# Patient Record
Sex: Female | Born: 1970 | Race: White | Hispanic: No | Smoking: Never smoker
Health system: Southern US, Community
[De-identification: ages and names within clinical notes are randomized; demographics above are authoritative.]

## PROBLEM LIST (undated history)

## (undated) DIAGNOSIS — B009 Herpesviral infection, unspecified: Secondary | ICD-10-CM

## (undated) DIAGNOSIS — D649 Anemia, unspecified: Secondary | ICD-10-CM

## (undated) DIAGNOSIS — D219 Benign neoplasm of connective and other soft tissue, unspecified: Secondary | ICD-10-CM

## (undated) DIAGNOSIS — R112 Nausea with vomiting, unspecified: Secondary | ICD-10-CM

## (undated) DIAGNOSIS — F909 Attention-deficit hyperactivity disorder, unspecified type: Secondary | ICD-10-CM

## (undated) DIAGNOSIS — Z9889 Other specified postprocedural states: Secondary | ICD-10-CM

## (undated) DIAGNOSIS — R51 Headache: Secondary | ICD-10-CM

## (undated) DIAGNOSIS — A64 Unspecified sexually transmitted disease: Secondary | ICD-10-CM

## (undated) DIAGNOSIS — K219 Gastro-esophageal reflux disease without esophagitis: Secondary | ICD-10-CM

## (undated) DIAGNOSIS — R87619 Unspecified abnormal cytological findings in specimens from cervix uteri: Secondary | ICD-10-CM

## (undated) HISTORY — PX: CERVICAL BIOPSY  W/ LOOP ELECTRODE EXCISION: SUR135

## (undated) HISTORY — DX: Benign neoplasm of connective and other soft tissue, unspecified: D21.9

## (undated) HISTORY — DX: Unspecified abnormal cytological findings in specimens from cervix uteri: R87.619

## (undated) HISTORY — DX: Unspecified sexually transmitted disease: A64

## (undated) HISTORY — DX: Herpesviral infection, unspecified: B00.9

## (undated) HISTORY — PX: COLPOSCOPY: SHX161

## (undated) HISTORY — DX: Attention-deficit hyperactivity disorder, unspecified type: F90.9

---

## 1998-11-01 ENCOUNTER — Other Ambulatory Visit: Admission: RE | Admit: 1998-11-01 | Discharge: 1998-11-01 | Payer: Self-pay | Admitting: Gynecology

## 2002-07-17 ENCOUNTER — Encounter: Payer: Self-pay | Admitting: Family Medicine

## 2002-07-17 ENCOUNTER — Encounter: Admission: RE | Admit: 2002-07-17 | Discharge: 2002-07-17 | Payer: Self-pay | Admitting: Family Medicine

## 2002-08-24 ENCOUNTER — Encounter: Admission: RE | Admit: 2002-08-24 | Discharge: 2002-08-24 | Payer: Self-pay | Admitting: Family Medicine

## 2002-08-24 ENCOUNTER — Encounter: Payer: Self-pay | Admitting: Family Medicine

## 2004-11-17 ENCOUNTER — Other Ambulatory Visit: Admission: RE | Admit: 2004-11-17 | Discharge: 2004-11-17 | Payer: Self-pay | Admitting: Obstetrics and Gynecology

## 2005-04-17 ENCOUNTER — Ambulatory Visit: Payer: Self-pay | Admitting: Internal Medicine

## 2005-04-24 ENCOUNTER — Ambulatory Visit: Payer: Self-pay | Admitting: Internal Medicine

## 2006-04-11 ENCOUNTER — Ambulatory Visit: Payer: Self-pay | Admitting: Gastroenterology

## 2006-05-16 ENCOUNTER — Ambulatory Visit: Payer: Self-pay | Admitting: Gastroenterology

## 2008-05-04 DIAGNOSIS — A64 Unspecified sexually transmitted disease: Secondary | ICD-10-CM

## 2008-05-04 DIAGNOSIS — B009 Herpesviral infection, unspecified: Secondary | ICD-10-CM

## 2008-05-04 HISTORY — DX: Herpesviral infection, unspecified: B00.9

## 2008-05-04 HISTORY — DX: Unspecified sexually transmitted disease: A64

## 2008-10-14 ENCOUNTER — Other Ambulatory Visit: Admission: RE | Admit: 2008-10-14 | Discharge: 2008-10-14 | Payer: Self-pay | Admitting: Obstetrics and Gynecology

## 2009-03-30 ENCOUNTER — Encounter: Admission: RE | Admit: 2009-03-30 | Discharge: 2009-03-30 | Payer: Self-pay | Admitting: Gastroenterology

## 2009-11-25 ENCOUNTER — Emergency Department (HOSPITAL_COMMUNITY): Admission: EM | Admit: 2009-11-25 | Discharge: 2009-11-26 | Payer: Self-pay | Admitting: Emergency Medicine

## 2010-08-20 LAB — DIFFERENTIAL
Eosinophils Relative: 1 % (ref 0–5)
Lymphocytes Relative: 16 % (ref 12–46)
Lymphs Abs: 1.3 10*3/uL (ref 0.7–4.0)
Monocytes Absolute: 0.5 10*3/uL (ref 0.1–1.0)
Monocytes Relative: 5 % (ref 3–12)

## 2010-08-20 LAB — URINALYSIS, ROUTINE W REFLEX MICROSCOPIC
Bilirubin Urine: NEGATIVE
Glucose, UA: NEGATIVE mg/dL
Hgb urine dipstick: NEGATIVE
Protein, ur: NEGATIVE mg/dL

## 2010-08-20 LAB — CBC
HCT: 33.5 % — ABNORMAL LOW (ref 36.0–46.0)
MCH: 29.5 pg (ref 26.0–34.0)
MCV: 90.2 fL (ref 78.0–100.0)
RBC: 3.71 MIL/uL — ABNORMAL LOW (ref 3.87–5.11)
WBC: 8.5 10*3/uL (ref 4.0–10.5)

## 2010-08-20 LAB — BASIC METABOLIC PANEL
CO2: 26 mEq/L (ref 19–32)
Chloride: 107 mEq/L (ref 96–112)
GFR calc Af Amer: >60 mL/min (ref >60)
Potassium: 3.5 mEq/L (ref 3.5–5.1)

## 2010-08-20 LAB — POCT PREGNANCY, URINE: Preg Test, Ur: NEGATIVE

## 2010-09-02 IMAGING — CR DG LUMBAR SPINE COMPLETE
5 series · 5 of 5 positions shown · non-contrast
Comparison: None.

CLINICAL DATA: Acute onset low back pain.  Difficulty walking or
standing.

LUMBAR SPINE - COMPLETE 4+ VIEW

[t l-spine a.p.]
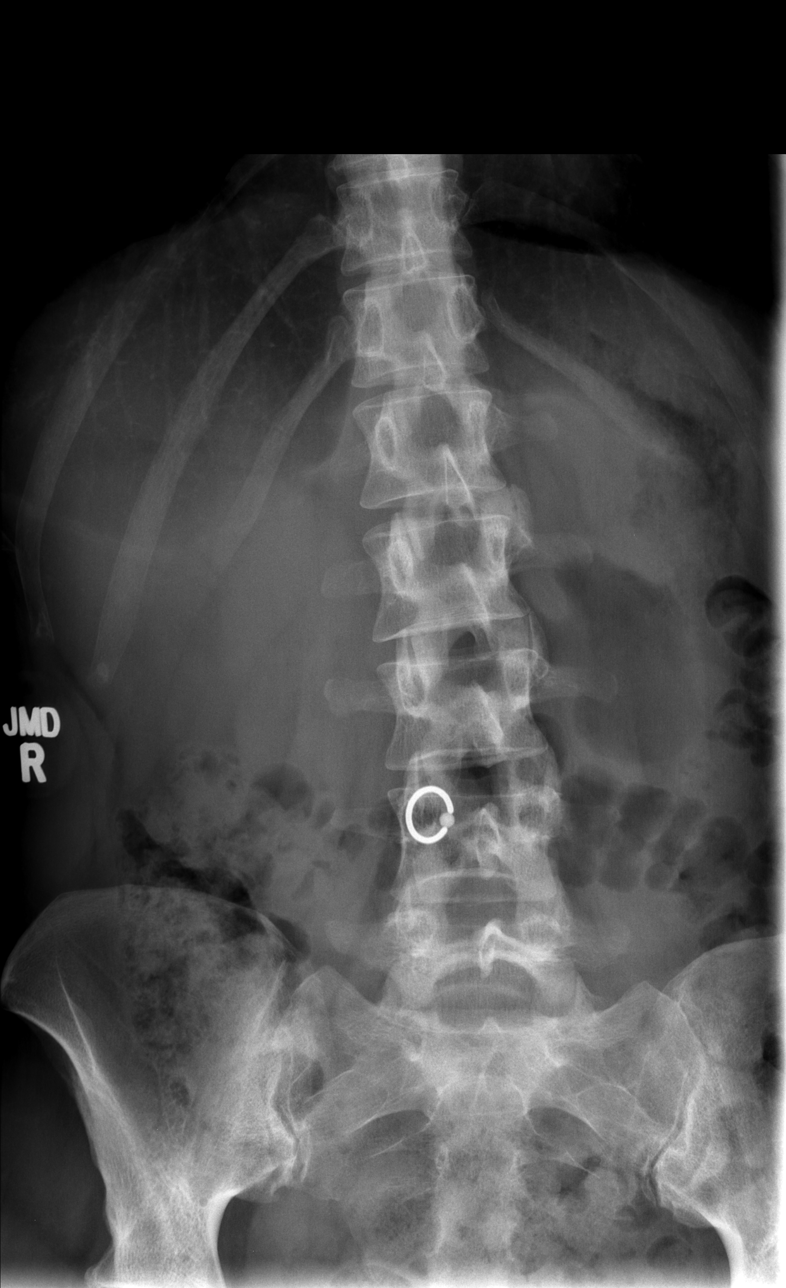

[t l-spine oblique exposure (1 of 2)]
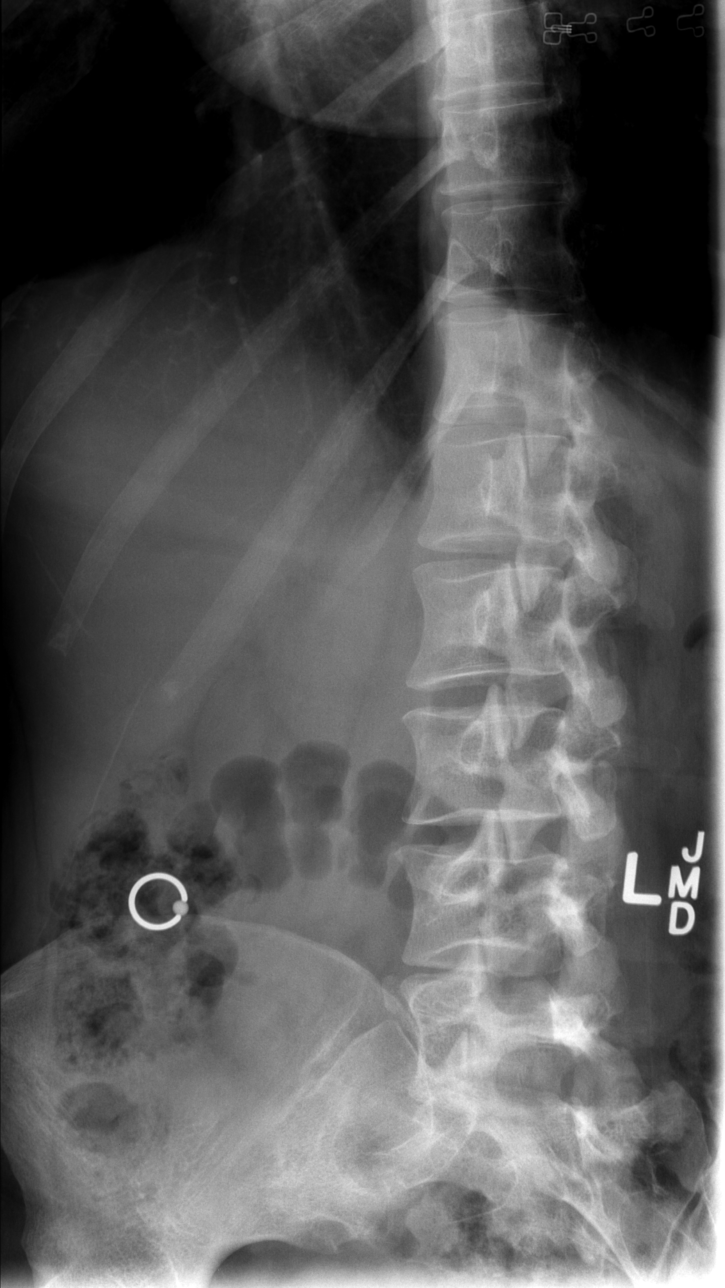

[t l-spine oblique exposure (2 of 2)]
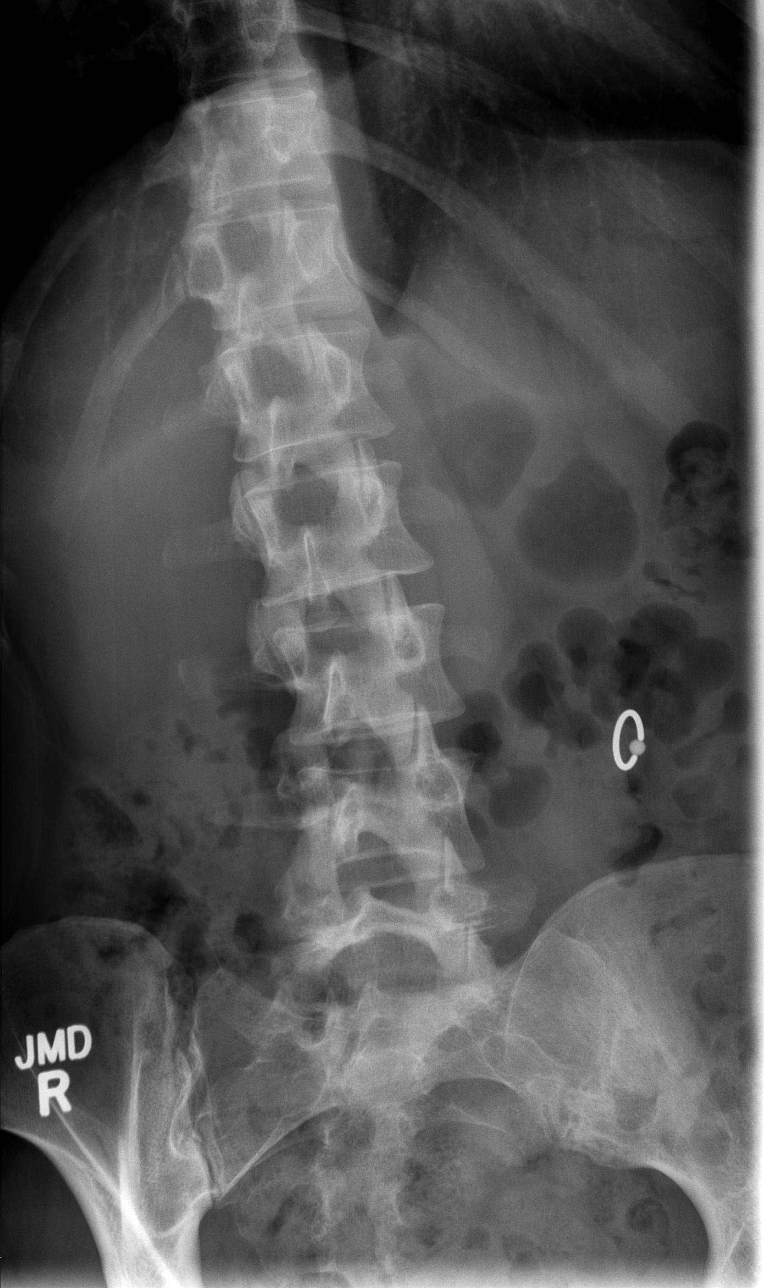

[t l-spine lat]
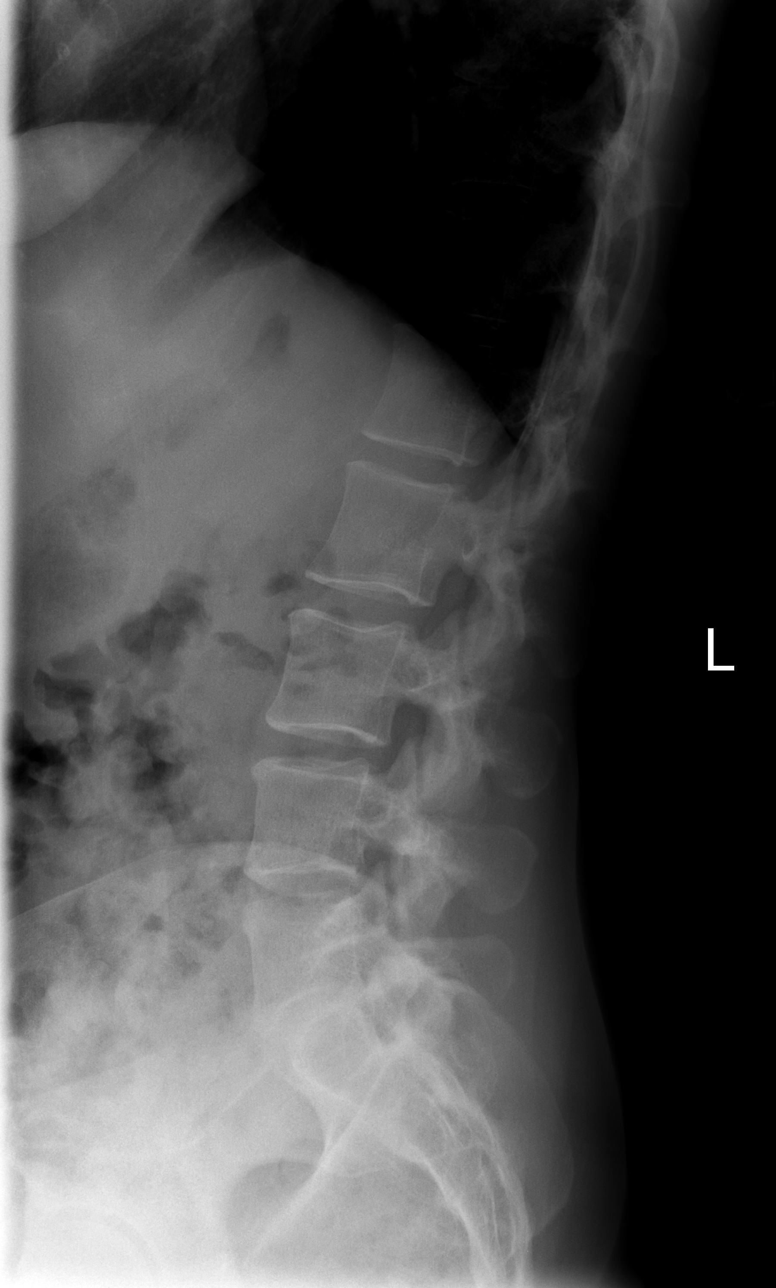

[t l-spine l5-s1 spot]
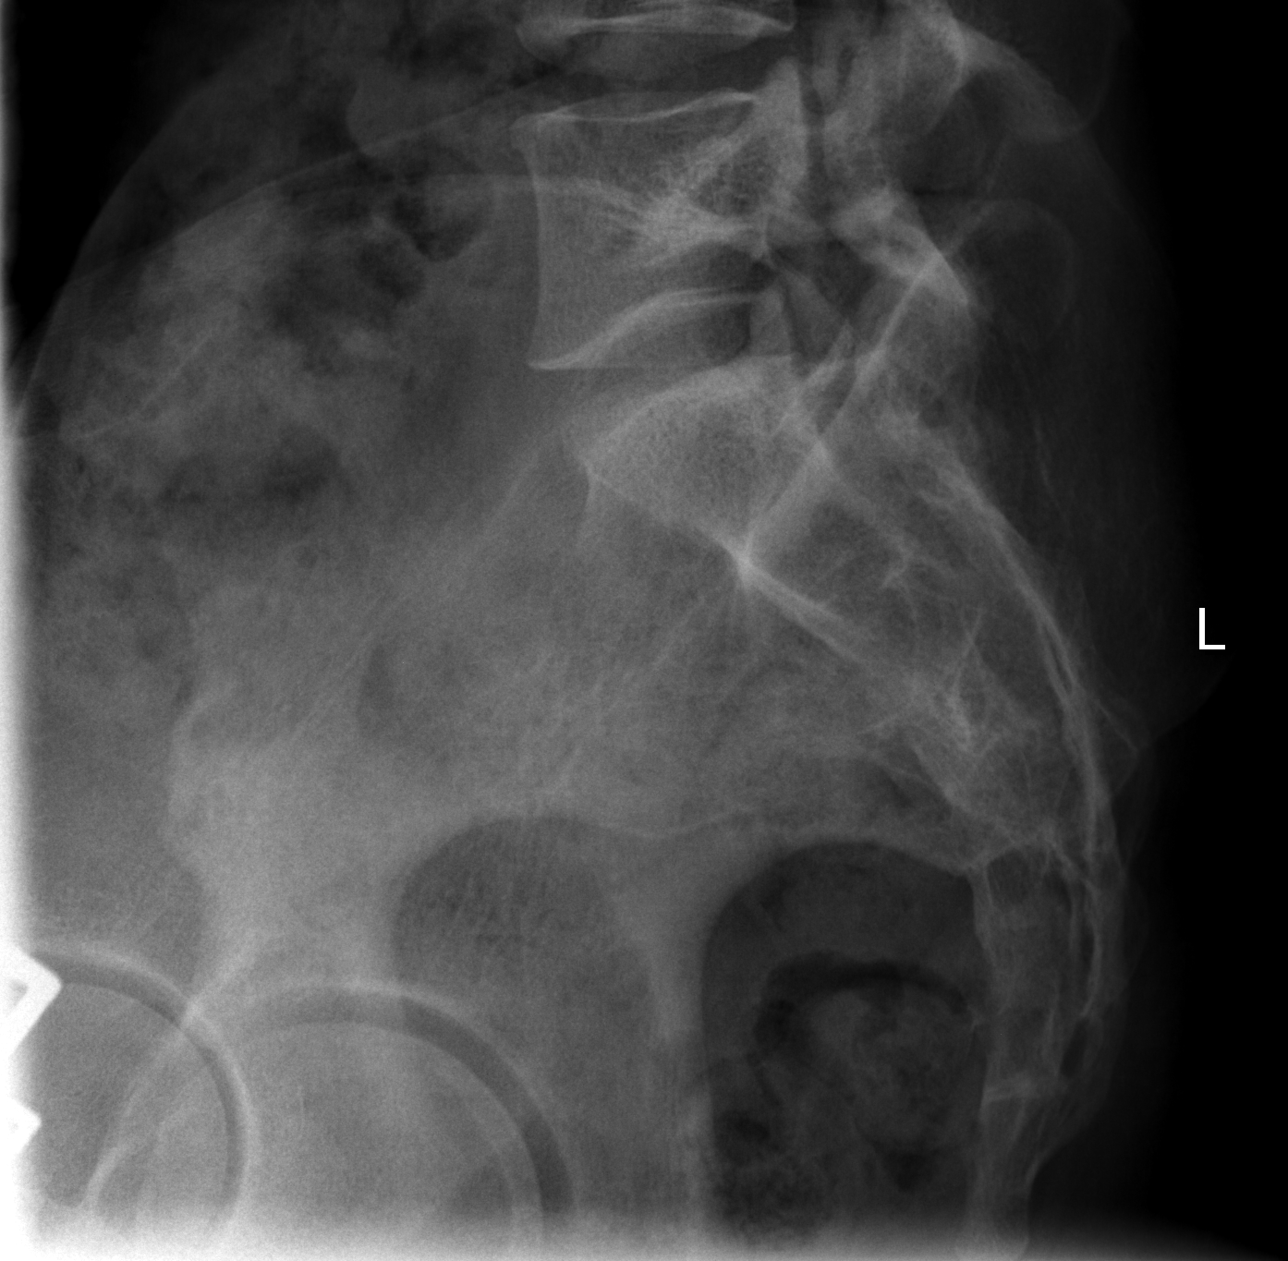

[5 of 5 positions shown; findings below may reference images not displayed]

FINDINGS: There is no evidence of lumbar spine fracture.
Alignment is normal.  Intervertebral disc spaces are maintained.
IMPRESSION: Negative.

## 2010-10-20 NOTE — Assessment & Plan Note (Signed)
[facility]                         GASTROENTEROLOGY OFFICE NOTE   Jillian Francis, Jillian Francis                      MRN:          [id number]  DATE:05/16/2006                            DOB:          [birth year]    This is a return office visit for right upper quadrant pain. Following  he prior office visit she declined to proceed with blood work, abdominal  ultrasound imaging, and  endoscopy. She feels that her symptoms  previously improved with the use of Protonix and wanted to try the  medication for several weeks prior to committing to further workup. She  has been using Protonix everyday and her symptoms have completely  resolved. She has no abdominal pain, heartburn, nausea, or vomiting. Her  weight is stable. Her appetite is good. She has no bowel habit changes.  She does note that if she misses a day of Protonix she tends to note a  return in symptoms.   CURRENT MEDICATIONS:  Allegra 180 q. day, Protonix 40 q. day.   MEDICATION ALLERGIES: None known.   PHYSICAL EXAMINATION:  Well-developed, well-nourished no acute distress,  weight 131 pounds. Blood pressure 102/68, pulse 78 and regular.  HEENT EXAM: Anicteric sclera, oropharynx clear.  CHEST: Clear to auscultation  bilaterally.  CARDIAC: Regular rate and rhythm without murmurs appreciated.  ABDOMEN: Soft, with very minimal right upper quadrant tenderness at deep  palpation. No rebound or guarding. No palpable organomegaly, masses, or  hernias. Normoactive bowel sounds.   ASSESSMENT/PLAN:  Presumed gastroesophageal reflux disease, gastritis,  or duodenitis. Continue Protonix 40 mg p.o. q. day for the next 2 to 3  months. If her symptoms continue to require daily acid suppressants or  they worsen in any way she is to return for followup in 2-3 months.  Consider H. pylori Ab testing if symptoms persist. Otherwise I will see  her back for 6 month followup.     Venita Lick. Russella Dar, MD, [facility]  Electronically Signed    MTS/MedQ  DD: 05/16/2006  DT: 05/16/2006  Job #: [id number]

## 2010-10-20 NOTE — Assessment & Plan Note (Signed)
[facility]                           GASTROENTEROLOGY OFFICE NOTE   Francis, Jillian Francis                      MRN:          [id number]  DATE:04/11/2006                            DOB:          10-Jan-1971    CHIEF COMPLAINT:  A 40 year old white female self-referred for right upper  quadrant abdominal pain.   HISTORY OF PRESENT ILLNESS:  Jillian Francis is referred by her father, Ananias Pilgrim, who is a patient of mine.  She relates recurrent problems with  right upper quadrant pain occurring episodically over the past two years.  The episodes have become more severe and more frequent over the past two  months.  She states she has been under emotional stress for the past year or  so due to a recent divorce.  She does have occasional heartburn and  indigestion.  She notes postprandial right upper quadrant pain that is  sometimes associated with right lower chest pain and abdominal bloating.  She denies any radiation to her back or shoulder blades.  She notes no  nausea, vomiting, diarrhea, constipation, melena or hematochezia.  She  typically has symptoms when she drinks several caffeinated beverages or  sodas.  She recently had a Big Mac which brought on fairly severe pain.  She  has used her father's Protonix on occasion which has improved her symptoms.  She has also used hydrocodone and Tagamet for severe attacks of pain.  She  notes about a seven pound very gradual weight loss over the past year or so  that she relates mainly to emotional stress.  Her mother has colon polyps.  No family members with colon cancer or inflammatory bowel disease.   PAST MEDICAL HISTORY:  1. Depression two and a half years ago.  2. Allergic rhinitis.   PAST SURGICAL HISTORY:  Negative.   CURRENT MEDICATIONS:  1. Allegra 180 mg daily.  2. Tagamet p.r.n.  3. Hydrocodone p.r.n.  4. Protonix p.r.n.   SOCIAL HISTORY:  She is divorced with one child.  She denies  tobacco product  and alcohol product usage.   REVIEW OF SYSTEMS:  Per the handwritten form.   PHYSICAL EXAMINATION:  Well-developed, well-nourished white female in no  acute distress.  Height 5 feet 9 inches.  Weight 132.4 pounds.  Blood  pressure is 110/66, pulse 64 and regular.  HEENT EXAM:  Anicteric sclerae.  Oropharynx clear.  CHEST:  Clear to auscultation bilaterally.  CARDIAC:  Regular rate and rhythm without murmurs appreciated.  ABDOMEN:  Soft with mild right upper quadrant tenderness to deep palpation.  No rebound or guarding.  No palpable organomegaly, masses or hernia.  Normal  active bowel sounds.  RECTAL:  Deferred.  EXTREMITIES:  Without clubbing, cyanosis, or edema.  NEUROLOGICAL:  Alert and oriented x3.  Grossly nonfocal.   ASSESSMENT/PLAN:  Recurrent right upper quadrant pain exacerbated by meals  and occasionally associated with heartburn:  Rule out cholelithiasis,  gastroesophageal reflux disease, ulcer disease and gastritis.  We will  obtain a CBC, CMET and lipase today.  Begin Protonix 40 mg p.o. q.a.m. along  with standard antireflux  measures.  Schedule abdominal ultrasound.  Schedule  upper endoscopy.  Risks, benefits, and alternatives to upper endoscopy with  possible biopsy discussed with the patient and she consents to proceed.  She  is to contact my office if her symptoms worsen.     Venita Lick. Russella Dar, MD, [facility]  Electronically Signed    MTS/MedQ  DD: 04/14/2006  DT: 04/14/2006  Job #: [id number]

## 2011-02-14 LAB — HM PAP SMEAR

## 2011-03-21 LAB — HM MAMMOGRAPHY: HM Mammogram: NEGATIVE

## 2011-07-27 ENCOUNTER — Encounter (HOSPITAL_COMMUNITY): Payer: Self-pay | Admitting: Pharmacist

## 2011-07-31 ENCOUNTER — Encounter (HOSPITAL_COMMUNITY): Payer: Self-pay

## 2011-07-31 ENCOUNTER — Encounter (HOSPITAL_COMMUNITY)
Admission: RE | Admit: 2011-07-31 | Discharge: 2011-07-31 | Disposition: A | Discharge status: Home / Self Care | Payer: BC Managed Care – PPO | Source: Ambulatory Visit | Attending: Obstetrics and Gynecology | Admitting: Obstetrics and Gynecology

## 2011-07-31 HISTORY — DX: Anemia, unspecified: D64.9

## 2011-07-31 HISTORY — DX: Headache: R51

## 2011-07-31 HISTORY — DX: Gastro-esophageal reflux disease without esophagitis: K21.9

## 2011-07-31 HISTORY — DX: Other specified postprocedural states: Z98.890

## 2011-07-31 HISTORY — DX: Other specified postprocedural states: R11.2

## 2011-07-31 LAB — CBC
HCT: 34.7 % — ABNORMAL LOW (ref 36.0–46.0)
Hemoglobin: 11.2 g/dL — ABNORMAL LOW (ref 12.0–15.0)
MCH: 29 pg (ref 26.0–34.0)
MCV: 89.9 fL (ref 78.0–100.0)
RBC: 3.86 MIL/uL — ABNORMAL LOW (ref 3.87–5.11)

## 2011-07-31 NOTE — Patient Instructions (Signed)
YOUR PROCEDURE IS SCHEDULED ON:08/06/11  ENTER THROUGH THE MAIN ENTRANCE OF [facility] AT:1130 am  USE DESK PHONE AND DIAL [id number] TO INFORM us OF YOUR ARRIVAL  CALL [phone] IF YOU HAVE ANY QUESTIONS OR PROBLEMS PRIOR TO YOUR ARRIVAL.  REMEMBER: DO NOT EAT AFTER MIDNIGHT :Sunday  SPECIAL INSTRUCTIONS:Clear liquids ok until 9am Monday   YOU MAY BRUSH YOUR TEETH THE MORNING OF SURGERY   TAKE THESE MEDICINES THE DAY OF SURGERY WITH SIP OF WATER:Zegerid   DO NOT WEAR JEWELRY, EYE MAKEUP, LIPSTICK OR DARK FINGERNAIL POLISH DO NOT WEAR LOTIONS  DO NOT SHAVE FOR 48 HOURS PRIOR TO SURGERY  YOU WILL NOT BE ALLOWED TO DRIVE YOURSELF HOME.  NAME OF DRIVER:Linda- mother

## 2011-08-01 NOTE — H&P (Signed)
Jillian Francis is an 41 y.o. female.   Chief Complaint: pelvic pain HPI: 41 yo DWF G1 P1 one C/S who presented 07/25/11 c/o severe intermittent lower pelvic pain over 4 days.  Evaluation with PUS showed her uterus was 11 x 10 x 8 cm with two 5 cm fibroids and one 2 cm fibroid.  Her adnexa were normal.  Her pain was felt to be due to fibroid necrosis.  She was treated with oxycodone and the pain spontaneously resolved in about two days. She presents today for definitive management via hysterectomy.  Past Medical History  Diagnosis Date  . Anemia   . GERD (gastroesophageal reflux disease)   . Headache   . PONV (postoperative nausea and vomiting)     Past Surgical History  Procedure Date  . Cesarean section 1997    No family history on file. Social History:  reports that she has never smoked. She does not have any smokeless tobacco history on file. She reports that she drinks alcohol. 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    Allergies:  Allergies  Allergen Reactions  . Morphine And Related Hives and Rash    Patient tolerates oxycodone    No current facility-administered medications on file as of .   Medications Prior to Admission  Medication Sig Dispense Refill  . olopatadine (PATANOL) 0.1 % ophthalmic solution Place 2 drops into both eyes 2 (two) times daily.        Results for orders placed during the hospital encounter of 07/31/11 (from the past 48 hour(s))  SURGICAL PCR SCREEN     Status: Normal   Collection Time   07/31/11  2:30 PM      Component Value Range Comment   MRSA, PCR NEGATIVE  NEGATIVE     Staphylococcus aureus NEGATIVE  NEGATIVE    CBC     Status: Abnormal   Collection Time   07/31/11  2:31 PM      Component Value Range Comment   WBC 7.1  4.0 - 10.5 (K/uL)    RBC 3.86 (*) 3.87 - 5.11 (MIL/uL)    Hemoglobin 11.2 (*) 12.0 - 15.0 (g/dL)    HCT 16.1 (*) 09.6 - 46.0 (%)    MCV 89.9  78.0 - 100.0 (fL)    MCH  29.0  26.0 - 34.0 (pg)    MCHC 32.3  30.0 - 36.0 (g/dL)    RDW 04.5  40.9 - 81.1 (%)    Platelets 292  150 - 400 (K/uL)    No results found.  Review of Systems  All other systems reviewed and are negative.    There were no vitals taken for this visit. Physical Exam  Constitutional: She is oriented to person, place, and time. She appears well-developed and well-nourished.  HENT:  Head: Normocephalic and atraumatic.  Eyes: Conjunctivae are normal.  Neck: Normal range of motion. Neck supple. No thyromegaly present.  Cardiovascular: Normal rate, regular rhythm and normal heart sounds.   Respiratory: Effort normal and breath sounds normal.  GI: Soft. Bowel sounds are normal.  Genitourinary:       Ext, vag, cx normal.  Uterus 12 week size, bulky.  Adnexa not palpable.  Musculoskeletal: Normal range of motion.  Neurological: She is alert and oriented to person, place, and time.  Skin: Skin is warm and dry.  Psychiatric: She has a normal mood and affect. Her behavior is normal.     Assessment/Plan  Large and painful fibroids.  For TLH with bilateral salpingectomy.  Kache Mcclurg P 08/01/2011, 1:19 PM

## 2011-08-05 MED ORDER — DEXTROSE 5 % IV SOLN
1.0000 g | INTRAVENOUS | Status: AC
  Administered 2011-08-06: 1 g via INTRAVENOUS
  Filled 2011-08-05: qty 1

## 2011-08-06 ENCOUNTER — Ambulatory Visit (HOSPITAL_COMMUNITY)
Admission: RE | Admit: 2011-08-06 | Discharge: 2011-08-06 | Disposition: A | Discharge status: Home / Self Care | Payer: BC Managed Care – PPO | Source: Ambulatory Visit | Attending: Obstetrics and Gynecology | Admitting: Obstetrics and Gynecology

## 2011-08-06 ENCOUNTER — Encounter (HOSPITAL_COMMUNITY)
Admission: RE | Disposition: A | Discharge status: Home / Self Care | Payer: Self-pay | Source: Ambulatory Visit | Attending: Obstetrics and Gynecology

## 2011-08-06 ENCOUNTER — Encounter (HOSPITAL_COMMUNITY): Payer: Self-pay | Admitting: *Deleted

## 2011-08-06 ENCOUNTER — Ambulatory Visit (HOSPITAL_COMMUNITY): Payer: BC Managed Care – PPO | Admitting: Anesthesiology

## 2011-08-06 ENCOUNTER — Encounter (HOSPITAL_COMMUNITY): Payer: Self-pay | Admitting: Anesthesiology

## 2011-08-06 DIAGNOSIS — D649 Anemia, unspecified: Secondary | ICD-10-CM | POA: Insufficient documentation

## 2011-08-06 DIAGNOSIS — Z01818 Encounter for other preprocedural examination: Secondary | ICD-10-CM | POA: Insufficient documentation

## 2011-08-06 DIAGNOSIS — N949 Unspecified condition associated with female genital organs and menstrual cycle: Secondary | ICD-10-CM | POA: Insufficient documentation

## 2011-08-06 DIAGNOSIS — Z01812 Encounter for preprocedural laboratory examination: Secondary | ICD-10-CM | POA: Insufficient documentation

## 2011-08-06 DIAGNOSIS — D251 Intramural leiomyoma of uterus: Secondary | ICD-10-CM | POA: Insufficient documentation

## 2011-08-06 DIAGNOSIS — D252 Subserosal leiomyoma of uterus: Secondary | ICD-10-CM | POA: Insufficient documentation

## 2011-08-06 HISTORY — PX: ROBOTIC ASSISTED LAPAROSCOPIC HYSTERECTOMY AND SALPINGECTOMY: SHX6379

## 2011-08-06 LAB — HCG, SERUM, QUALITATIVE: Preg, Serum: NEGATIVE

## 2011-08-06 SURGERY — ROBOTIC ASSISTED TOTAL HYSTERECTOMY
Anesthesia: General | Site: Abdomen | Wound class: Clean Contaminated

## 2011-08-06 MED ORDER — ONDANSETRON HCL 4 MG/2ML IJ SOLN
INTRAMUSCULAR | Status: DC | PRN
  Administered 2011-08-06: 4 mg via INTRAVENOUS

## 2011-08-06 MED ORDER — DEXAMETHASONE SODIUM PHOSPHATE 4 MG/ML IJ SOLN
INTRAMUSCULAR | Status: DC | PRN
  Administered 2011-08-06: 10 mg via INTRAVENOUS

## 2011-08-06 MED ORDER — ROPIVACAINE HCL 5 MG/ML IJ SOLN
INTRAMUSCULAR | Status: DC | PRN
  Administered 2011-08-06: 60 mL via EPIDURAL

## 2011-08-06 MED ORDER — INDIGOTINDISULFONATE SODIUM 8 MG/ML IJ SOLN
INTRAMUSCULAR | Status: AC
  Filled 2011-08-06: qty 5

## 2011-08-06 MED ORDER — GLYCOPYRROLATE 0.2 MG/ML IJ SOLN
INTRAMUSCULAR | Status: AC
  Filled 2011-08-06: qty 1

## 2011-08-06 MED ORDER — KETOROLAC TROMETHAMINE 30 MG/ML IJ SOLN
INTRAMUSCULAR | Status: AC
  Filled 2011-08-06: qty 1

## 2011-08-06 MED ORDER — ACETAMINOPHEN 325 MG PO TABS: 650.0000 mg | ORAL_TABLET | ORAL | Status: DC | PRN

## 2011-08-06 MED ORDER — PROPOFOL 10 MG/ML IV EMUL
INTRAVENOUS | Status: AC
  Filled 2011-08-06: qty 20

## 2011-08-06 MED ORDER — FENTANYL CITRATE 0.05 MG/ML IJ SOLN: 25.0000 ug | INTRAMUSCULAR | Status: DC | PRN

## 2011-08-06 MED ORDER — HETASTARCH-ELECTROLYTES 6 % IV SOLN
INTRAVENOUS | Status: AC
  Filled 2011-08-06: qty 500

## 2011-08-06 MED ORDER — ONDANSETRON HCL 4 MG/2ML IJ SOLN
INTRAMUSCULAR | Status: AC
  Filled 2011-08-06: qty 2

## 2011-08-06 MED ORDER — LACTATED RINGERS IV SOLN
INTRAVENOUS | Status: DC
  Administered 2011-08-06: 15:00:00 via INTRAVENOUS
  Administered 2011-08-06: 50 mL/h via INTRAVENOUS
  Administered 2011-08-06: 17:00:00 via INTRAVENOUS

## 2011-08-06 MED ORDER — GLYCOPYRROLATE 0.2 MG/ML IJ SOLN
INTRAMUSCULAR | Status: DC | PRN
  Administered 2011-08-06: 0.1 mg via INTRAVENOUS
  Administered 2011-08-06: .4 mg via INTRAVENOUS

## 2011-08-06 MED ORDER — ROCURONIUM BROMIDE 50 MG/5ML IV SOLN
INTRAVENOUS | Status: AC
  Filled 2011-08-06: qty 1

## 2011-08-06 MED ORDER — FENTANYL CITRATE 0.05 MG/ML IJ SOLN
INTRAMUSCULAR | Status: AC
  Filled 2011-08-06: qty 2

## 2011-08-06 MED ORDER — FENTANYL CITRATE 0.05 MG/ML IJ SOLN
INTRAMUSCULAR | Status: DC | PRN
  Administered 2011-08-06 (×2): 50 ug via INTRAVENOUS
  Administered 2011-08-06: 100 ug via INTRAVENOUS
  Administered 2011-08-06: 50 ug via INTRAVENOUS

## 2011-08-06 MED ORDER — SODIUM CHLORIDE 0.9 % IJ SOLN: 3.0000 mL | INTRAMUSCULAR | Status: DC | PRN

## 2011-08-06 MED ORDER — SODIUM CHLORIDE 0.9 % IJ SOLN
INTRAMUSCULAR | Status: DC | PRN
  Administered 2011-08-06: 60 mL

## 2011-08-06 MED ORDER — ROCURONIUM BROMIDE 100 MG/10ML IV SOLN
INTRAVENOUS | Status: DC | PRN
  Administered 2011-08-06: 10 mg via INTRAVENOUS
  Administered 2011-08-06: 20 mg via INTRAVENOUS
  Administered 2011-08-06: 50 mg via INTRAVENOUS
  Administered 2011-08-06: 5 mg via INTRAVENOUS
  Administered 2011-08-06: 10 mg via INTRAVENOUS
  Administered 2011-08-06: 5 mg via INTRAVENOUS

## 2011-08-06 MED ORDER — HYDROMORPHONE HCL PF 1 MG/ML IJ SOLN
INTRAMUSCULAR | Status: DC | PRN
  Administered 2011-08-06: 1 mg via INTRAVENOUS

## 2011-08-06 MED ORDER — MIDAZOLAM HCL 2 MG/2ML IJ SOLN
INTRAMUSCULAR | Status: AC
  Filled 2011-08-06: qty 2

## 2011-08-06 MED ORDER — KETOROLAC TROMETHAMINE 30 MG/ML IJ SOLN
15.0000 mg | Freq: Once | INTRAMUSCULAR | Status: AC | PRN
  Administered 2011-08-06: 30 mg via INTRAVENOUS

## 2011-08-06 MED ORDER — ONDANSETRON HCL 4 MG/2ML IJ SOLN
4.0000 mg | Freq: Once | INTRAMUSCULAR | Status: AC
  Administered 2011-08-06: 4 mg via INTRAVENOUS
  Filled 2011-08-06: qty 2

## 2011-08-06 MED ORDER — ROPIVACAINE HCL 5 MG/ML IJ SOLN
INTRAMUSCULAR | Status: AC
  Filled 2011-08-06: qty 60

## 2011-08-06 MED ORDER — ACETAMINOPHEN 650 MG RE SUPP
650.0000 mg | RECTAL | Status: DC | PRN
  Filled 2011-08-06: qty 1

## 2011-08-06 MED ORDER — OXYCODONE HCL 5 MG PO TABS: 5.0000 mg | ORAL_TABLET | ORAL | Status: DC | PRN

## 2011-08-06 MED ORDER — MIDAZOLAM HCL 5 MG/5ML IJ SOLN
INTRAMUSCULAR | Status: DC | PRN
  Administered 2011-08-06: 2 mg via INTRAVENOUS

## 2011-08-06 MED ORDER — DEXAMETHASONE SODIUM PHOSPHATE 10 MG/ML IJ SOLN
INTRAMUSCULAR | Status: AC
  Filled 2011-08-06: qty 1

## 2011-08-06 MED ORDER — NEOSTIGMINE METHYLSULFATE 1 MG/ML IJ SOLN
INTRAMUSCULAR | Status: AC
  Filled 2011-08-06: qty 10

## 2011-08-06 MED ORDER — INDIGOTINDISULFONATE SODIUM 8 MG/ML IJ SOLN
INTRAMUSCULAR | Status: DC | PRN
  Administered 2011-08-06: 4 mL via INTRAVENOUS

## 2011-08-06 MED ORDER — HYDROMORPHONE HCL PF 1 MG/ML IJ SOLN
INTRAMUSCULAR | Status: AC
  Filled 2011-08-06: qty 1

## 2011-08-06 MED ORDER — PROPOFOL 10 MG/ML IV EMUL
INTRAVENOUS | Status: DC | PRN
  Administered 2011-08-06: 150 mg via INTRAVENOUS

## 2011-08-06 MED ORDER — HETASTARCH-ELECTROLYTES 6 % IV SOLN
INTRAVENOUS | Status: DC | PRN
  Administered 2011-08-06: 15:00:00 via INTRAVENOUS

## 2011-08-06 MED ORDER — LIDOCAINE HCL (CARDIAC) 20 MG/ML IV SOLN
INTRAVENOUS | Status: AC
  Filled 2011-08-06: qty 5

## 2011-08-06 MED ORDER — NEOSTIGMINE METHYLSULFATE 1 MG/ML IJ SOLN
INTRAMUSCULAR | Status: DC | PRN
  Administered 2011-08-06: 3 mg via INTRAVENOUS

## 2011-08-06 MED ORDER — LACTATED RINGERS IR SOLN
Status: DC | PRN
  Administered 2011-08-06: 3000 mL

## 2011-08-06 MED ORDER — LIDOCAINE HCL (CARDIAC) 20 MG/ML IV SOLN
INTRAVENOUS | Status: DC | PRN
  Administered 2011-08-06: 60 mg via INTRAVENOUS

## 2011-08-06 MED ORDER — FENTANYL CITRATE 0.05 MG/ML IJ SOLN
INTRAMUSCULAR | Status: AC
  Filled 2011-08-06: qty 5

## 2011-08-06 SURGICAL SUPPLY — 75 items
ADH SKN CLS APL DERMABOND .7 (GAUZE/BANDAGES/DRESSINGS) ×1
APL SKNCLS STERI-STRIP NONHPOA (GAUZE/BANDAGES/DRESSINGS) ×1
BAG URINE DRAINAGE (UROLOGICAL SUPPLIES) ×2 IMPLANT
BARRIER ADHS 3X4 INTERCEED (GAUZE/BANDAGES/DRESSINGS) ×2 IMPLANT
BENZOIN TINCTURE PRP APPL 2/3 (GAUZE/BANDAGES/DRESSINGS) ×2 IMPLANT
BRR ADH 4X3 ABS CNTRL BYND (GAUZE/BANDAGES/DRESSINGS) ×1
CABLE HIGH FREQUENCY MONO STRZ (ELECTRODE) ×2 IMPLANT
CATH FOLEY 3WAY  5CC 16FR (CATHETERS) ×1
CATH FOLEY 3WAY 5CC 16FR (CATHETERS) ×1 IMPLANT
CONT PATH 16OZ SNAP LID 3702 (MISCELLANEOUS) ×2 IMPLANT
COVER MAYO STAND STRL (DRAPES) ×2 IMPLANT
COVER TABLE BACK 60X90 (DRAPES) ×4 IMPLANT
COVER TIP SHEARS 8 DVNC (MISCELLANEOUS) ×1 IMPLANT
COVER TIP SHEARS 8MM DA VINCI (MISCELLANEOUS) ×1
DECANTER SPIKE VIAL GLASS SM (MISCELLANEOUS) ×2 IMPLANT
DERMABOND ADVANCED (GAUZE/BANDAGES/DRESSINGS) ×1
DERMABOND ADVANCED .7 DNX12 (GAUZE/BANDAGES/DRESSINGS) IMPLANT
DRAPE HUG U DISPOSABLE (DRAPE) ×2 IMPLANT
DRAPE LG THREE QUARTER DISP (DRAPES) ×4 IMPLANT
DRAPE MONITOR DA VINCI (DRAPE) ×1 IMPLANT
DRAPE WARM FLUID 44X44 (DRAPE) ×2 IMPLANT
ELECT REM PT RETURN 9FT ADLT (ELECTROSURGICAL) ×2
ELECTRODE REM PT RTRN 9FT ADLT (ELECTROSURGICAL) ×1 IMPLANT
EVACUATOR SMOKE 8.L (FILTER) ×2 IMPLANT
GAUZE VASELINE 3X9 (GAUZE/BANDAGES/DRESSINGS) IMPLANT
GLOVE BIO SURGEON STRL SZ 6.5 (GLOVE) ×6 IMPLANT
GLOVE BIOGEL PI IND STRL 7.0 (GLOVE) ×4 IMPLANT
GLOVE BIOGEL PI INDICATOR 7.0 (GLOVE) ×4
GLOVE ECLIPSE 6.5 STRL STRAW (GLOVE) ×8 IMPLANT
GOWN STRL REIN XL XLG (GOWN DISPOSABLE) ×12 IMPLANT
GYRUS RUMI II 2.5CM BLUE (DISPOSABLE)
GYRUS RUMI II 3.5CM BLUE (DISPOSABLE)
GYRUS RUMI II 4.0CM BLUE (DISPOSABLE)
KIT ACCESSORY DA VINCI DISP (KITS) ×1
KIT ACCESSORY DVNC DISP (KITS) ×1 IMPLANT
KIT DISP ACCESSORY 4 ARM (KITS) IMPLANT
NDL INSUFFLATION 14GA 120MM (NEEDLE) ×1 IMPLANT
NEEDLE INSUFFLATION 14GA 120MM (NEEDLE) ×2 IMPLANT
NS IRRIG 1000ML POUR BTL (IV SOLUTION) ×6 IMPLANT
OCCLUDER COLPOPNEUMO (BALLOONS) ×1 IMPLANT
PACK LAVH (CUSTOM PROCEDURE TRAY) ×2 IMPLANT
PAD PREP 24X48 CUFFED NSTRL (MISCELLANEOUS) ×4 IMPLANT
PLUG CATH AND CAP STER (CATHETERS) ×2 IMPLANT
PROTECTOR NERVE ULNAR (MISCELLANEOUS) ×4 IMPLANT
RUMI II 3.0CM BLUE KOH-EFFICIE (DISPOSABLE) IMPLANT
RUMI II GYRUS 2.5CM BLUE (DISPOSABLE) IMPLANT
RUMI II GYRUS 3.5CM BLUE (DISPOSABLE) IMPLANT
RUMI II GYRUS 4.0CM BLUE (DISPOSABLE) IMPLANT
SET CYSTO W/LG BORE CLAMP LF (SET/KITS/TRAYS/PACK) ×2 IMPLANT
SET IRRIG TUBING LAPAROSCOPIC (IRRIGATION / IRRIGATOR) ×2 IMPLANT
SOLUTION ELECTROLUBE (MISCELLANEOUS) ×2 IMPLANT
SPONGE LAP 18X18 X RAY DECT (DISPOSABLE) IMPLANT
STRIP CLOSURE SKIN 1/4X4 (GAUZE/BANDAGES/DRESSINGS) ×1 IMPLANT
SUT VIC AB 0 CT1 27 (SUTURE) ×24
SUT VIC AB 0 CT1 27XBRD ANBCTR (SUTURE) ×1 IMPLANT
SUT VIC AB 0 CT1 27XBRD ANTBC (SUTURE) ×11 IMPLANT
SUT VICRYL 0 UR6 27IN ABS (SUTURE) ×2 IMPLANT
SUT VICRYL RAPIDE 4/0 PS 2 (SUTURE) ×4 IMPLANT
SUT VLOC 180 0 9IN  GS21 (SUTURE)
SUT VLOC 180 0 9IN GS21 (SUTURE) IMPLANT
SYR 30ML LL (SYRINGE) ×2 IMPLANT
SYR 50ML LL SCALE MARK (SYRINGE) ×2 IMPLANT
SYSTEM CONVERTIBLE TROCAR (TROCAR) IMPLANT
TIP UTERINE 5.1X6CM LAV DISP (MISCELLANEOUS) IMPLANT
TIP UTERINE 6.7X10CM GRN DISP (MISCELLANEOUS) IMPLANT
TIP UTERINE 6.7X6CM WHT DISP (MISCELLANEOUS) IMPLANT
TIP UTERINE 6.7X8CM BLUE DISP (MISCELLANEOUS) ×1 IMPLANT
TOWEL OR 17X24 6PK STRL BLUE (TOWEL DISPOSABLE) ×6 IMPLANT
TROCAR 12M 150ML BLUNT (TROCAR) IMPLANT
TROCAR DISP BLADELESS 8 DVNC (TROCAR) ×1 IMPLANT
TROCAR DISP BLADELESS 8MM (TROCAR) ×1
TROCAR XCEL 12X100 BLDLESS (ENDOMECHANICALS) IMPLANT
TROCAR XCEL NON-BLD 5MMX100MML (ENDOMECHANICALS) ×2 IMPLANT
TUBING FILTER THERMOFLATOR (ELECTROSURGICAL) ×2 IMPLANT
WARMER LAPAROSCOPE (MISCELLANEOUS) ×2 IMPLANT

## 2011-08-06 NOTE — Transfer of Care (Signed)
Immediate Anesthesia Transfer of Care Note  Patient: Jillian Francis  Procedure(s) Performed: Procedure(s) (LRB): ROBOTIC ASSISTED TOTAL HYSTERECTOMY (N/A)  Patient Location: PACU  Anesthesia Type: General  Level of Consciousness: awake, alert  and oriented  Airway & Oxygen Therapy: Patient Spontanous Breathing and Patient connected to nasal cannula oxygen  Post-op Assessment: Report given to PACU RN and Post -op Vital signs reviewed and stable  Post vital signs: stable  Complications: No apparent anesthesia complications

## 2011-08-06 NOTE — Anesthesia Preprocedure Evaluation (Signed)
Anesthesia Evaluation  Patient identified by MRN, date of birth, ID band Patient awake    Reviewed: Allergy & Precautions, H&P , NPO status , Patient's Chart, lab work & pertinent test results, reviewed documented beta blocker date and time   History of Anesthesia Complications (+) PONV  Airway Mallampati: I TM Distance: >3 FB Neck ROM: full    Dental  (+) Teeth Intact,    Pulmonary neg pulmonary ROS,  breath sounds clear to auscultation  Pulmonary exam normal       Cardiovascular Exercise Tolerance: Good negative cardio ROS  Rhythm:regular Rate:Normal     Neuro/Psych  Headaches (has caffeine withdrawl headache now), negative psych ROS   GI/Hepatic Neg liver ROS, GERD-  Medicated,  Endo/Other  negative endocrine ROS  Renal/GU negative Renal ROS  negative genitourinary   Musculoskeletal   Abdominal   Peds  Hematology negative hematology ROS (+)   Anesthesia Other Findings   Reproductive/Obstetrics negative OB ROS                           Anesthesia Physical Anesthesia Plan  ASA: II  Anesthesia Plan: General ETT   Post-op Pain Management:    Induction:   Airway Management Planned:   Additional Equipment:   Intra-op Plan:   Post-operative Plan:   Informed Consent: I have reviewed the patients History and Physical, chart, labs and discussed the procedure including the risks, benefits and alternatives for the proposed anesthesia with the patient or authorized representative who has indicated his/her understanding and acceptance.   Dental Advisory Given  Plan Discussed with: CRNA and Surgeon  Anesthesia Plan Comments:         Anesthesia Quick Evaluation

## 2011-08-06 NOTE — Anesthesia Postprocedure Evaluation (Signed)
Anesthesia Post Note  Patient: Jillian Francis  Procedure(s) Performed: Procedure(s) (LRB): ROBOTIC ASSISTED TOTAL HYSTERECTOMY (N/A)  Anesthesia type: General  Patient location: PACU  Post pain: Pain level controlled  Post assessment: Post-op Vital signs reviewed  Last Vitals:  Filed Vitals:   08/06/11 1800  BP: 128/78  Pulse: 92  Temp: 37.4 C  Resp: 23    Post vital signs: Reviewed  Level of consciousness: sedated  Complications: No apparent anesthesia complications

## 2011-08-06 NOTE — Op Note (Signed)
Preoperative diagnosis: Fibroids, pelvic pain Postoperative diagnosis: Same, path pending Procedure: Robotic assisted total laparoscopic hysterectomy and bilateral salpingectomy Surgeon Dr. Meredeth Ide Assistant: Dr. Leda Quail Anesthesia: Gen. endotracheal Estimated blood loss: 50 cc Complications: None Procedure: The patient was taken to the operating room and after induction of adequate general endotracheal anesthesia was placed in the dorsal lithotomy position and prepped and draped in usual fashion.  A posterior weighted and anterior Deaver retractor were placed.  The cervix was grasped on its anterior lip with a single-tooth tenaculum.  The cervix was dilated to a #21 Shawnie Pons.  The uterus sounded to 8 cm.  A #8 Rumi manipulator with a medium Koh ring was placed.  The bladder was drained with a foley catheter.  Attention was next turned to the abdomen.  The assistant had made a small nick in the umbilicus while surgeon was putting in the vaginal instruments.  The varies needle was inserted into the peritoneal space.  Proper placement was tested by noting a negative aspirate, and free flow of saline through the varies needle again with a negative aspirate, and then by noting the response of a drop of saline placed at the hub of the needle as the abdominal wall was elevated.  Pneumoperitoneum was then created with 2.5 L of CO2 using the automatic insufflator.  A small area in the left upper quadrant was infiltrated with dilute ropivacaine incised with a knife and a 5 mm trocar was inserted into the peritoneal space.  Proper placement was noted with the laparoscope.  Examination of the pelvis revealed there to be an adhesion of the omentum to the anterior abdominal wall, but otherwise the uterus was mobile.  It was greatly enlarged with 2 dominant fibroids each measuring about 5 cm one on the patient's left and the lower uterine segment and one on the patient's right near the fundus there were also  several smaller fibroids.  The ovaries appeared normal as did the tubes.  The laparoscope was used to identify appropriate placement for the 2 8mm robotic ports.  Abdominal wall was transilluminated, infiltrated with dilute ropivacaine, incised with a knife, and 2 8mm robotic ports were placed to the right and the left of the umbilicus under direct visualization.  60 cc of dilute ropivacaine was instilled into the pelvis.  The superior aspect of the umbilicus was infiltrated with ropivacaine, incised with a knife, and a 12 mm bladed trocar was inserted under direct visualization.  The robot was then brought in and out from the side.  Monopolar scissors were were placed and port one in the PK gyrus in port 2, the instruments were placed under direct visualization.  The surgeon then proceeded to the consult.  The adhesions of the omentum to the anterior abdominal wall were taken down with cautery.  The anterior and posterior cul-de-sacs were free.  The ureter on the right was identified.  The utero-ovarian ligament was cauterized and cut.  The mesosalpinx was cauterized and cut.  The round ligament was cauterized and cut.  And the anterior and posterior leaves of the broad ligament were taken down sharply.  The patient had a previous cesarean sections and there were some adhesions to the anterior lower uterine segment and these were taken down sharply.  The uterine artery on the patient's right was skeletonized, coagulated multiple times, and cut.  Attention was then turned to the patient's left.  The mesosalpinx was cauterized and cut to free up the tube.  The utero-ovarian ligament  was cauterized and cut as was the round ligament.  The anterior and posterior leaves of the broad ligament were taken down sharply.  Further dissection was done to dissected the bladder off the [facility] ring.  The left uterine artery was skeletonized, coagulated multiple times, and cut.  It was apparent that the uterus was not going to 50 the  vagina to be removed, so an incision was made with the monopolar scissors overeat of the 2 fibroids that were 5 cm.  The fibroids were dissected almost completely off the uterus until they tangled freely.  The uterus and the attached fibroids could then be delivered into the vagina.  The robotic instruments were changed out for a long nose forceps and a suture cut needle driver, and 4 sutures of 0 Vicryl were dropped into the abdomen through the M. billable port.  In the process of moving be sutures into the pelvis 2 of the 4 sutures were cut off their respective needles.  Therefore these 2 needles were taken over to the right paracolic gutter and parked in the peritoneum.  The other 2 needles were taken down to the pelvis and were used to "her the vagina.  When these 2 figure-of-eight sutures of Vicryl had been placed, 2 more sutures were dropped into the abdomen.  The sutures were then used to complete closure of the vagina so there were 4 figure-of-eight sutures across the vaginal cuff.  As the sutures were placed and cut the needles were parked in the paracolic gutter.  Upon completion of closure the vagina, and the pelvis was irrigated, and inspected.  All the pedicles were hemostatic.  A sheet of Interceed was placed over the vaginal cuff.  Robotic instruments were then removed.  Robotic scope was replaced with the 5 mm scope which was inserted through the assistant's port, and all 6 needles were removed from the paracolic gutter intact.  Inspection of the upper abdomen revealed the liver edge to be smooth the gallbladder was distended falciform ligament was normal and no abnormalities in the upper abdomen were seen.  The fascia at the umbilicus was then closed with a single suture of 0 Vicryl.  The skin was closed subcuticularly with 4-0 Vicryl Rapide.  The Foley catheter was removed, and the bladder was filled with 300 cc of sterile water.  Cystoscopy was carried out and the patient was noted to have good  reflux of urine tube both ureters bilaterally.  She had previously been given an adequate indigo carmine prior to the completion of cystoscopy indigo carmine could also be seen coming from the ureters.  The remainder the bladder also appeared normal.  The bladder was drained and the scope was withdrawn.  The procedure was terminated.  Sponge needle instrument counts were correct.  The patient was taken to the recovery room in satisfactory condition.

## 2011-08-06 NOTE — Interval H&P Note (Signed)
History and Physical Interval Note:  08/06/2011 12:52 PM  Jillian Francis  has presented today for surgery, with the diagnosis of uterine fibroids  The various methods of treatment have been discussed with the patient and family. After consideration of risks, benefits and other options for treatment, the patient has consented to  Procedure(s) (LRB): ROBOTIC ASSISTED TOTAL HYSTERECTOMY (N/A) as a surgical intervention .  The patients' history has been reviewed, patient examined, no change in status, stable for surgery.  I have reviewed the patients' chart and labs.  Questions were answered to the patient's satisfaction.     [name]

## 2011-08-10 NOTE — Discharge Summary (Signed)
Physician Discharge Summary  Patient ID: Jillian Francis MRN: [id number] DOB/AGE: May 02, 1971 41 y.o.  Admit date: 08/06/2011 Discharge date: 08/10/2011  Admission Diagnoses:fibroids, pelvic pain  Discharge Diagnoses: same Active Problems:  * No active hospital problems. *    Discharged Condition: good  Hospital Course:Pt had an outpatient total laparoscopic hysterectomy, robotically assisted, with bilateral salpingectomy.   Consults: None  Significant Diagnostic Studies: labs  satisfactory for surgery  Treatments: surgery: Robotic TLH, bilat salpingectomy  Discharge Exam: Blood pressure 113/75, pulse 90, temperature 98.6 F (37 C), temperature source Oral, resp. rate 18, height 5\' 9"  (1.753 m), weight 68.04 kg (150 lb), SpO2 95.00%. No exam other than surgeryNo exam done other than at surgery.  Disposition: 01-Home or Self Care   Medication List  As of 08/10/2011  5:26 PM   TAKE these medications         olopatadine 0.1 % ophthalmic solution   Commonly known as: PATANOL   Place 2 drops into both eyes 2 (two) times daily.      Omeprazole-Sodium Bicarbonate 20-1100 MG Caps   Commonly known as: ZEGERID   Take 2 capsules by mouth daily before breakfast.      tretinoin 0.025 % cream   Commonly known as: RETIN-A   Apply 1 application topically at bedtime as needed.           Follow-up Information    Follow up with Tanesha Arambula P, MD. (as scheduled)    Contact information:   [address] [place] [address] [phone]          Signed: Alison Murray 08/10/2011, 5:26 PM

## 2012-05-19 LAB — HM MAMMOGRAPHY

## 2013-02-23 ENCOUNTER — Encounter: Payer: Self-pay | Admitting: Nurse Practitioner

## 2013-02-24 ENCOUNTER — Ambulatory Visit: Payer: Self-pay | Admitting: Nurse Practitioner

## 2013-02-24 ENCOUNTER — Encounter: Payer: Self-pay | Admitting: Nurse Practitioner

## 2013-04-13 ENCOUNTER — Ambulatory Visit: Payer: Self-pay | Admitting: Obstetrics and Gynecology

## 2013-04-13 ENCOUNTER — Ambulatory Visit (INDEPENDENT_AMBULATORY_CARE_PROVIDER_SITE_OTHER): Payer: BC Managed Care – PPO | Admitting: Certified Nurse Midwife

## 2013-04-13 ENCOUNTER — Encounter: Payer: Self-pay | Admitting: Certified Nurse Midwife

## 2013-04-13 VITALS — BP 98/62 | HR 68 | Resp 16 | Ht 68.0 in | Wt 144.0 lb

## 2013-04-13 DIAGNOSIS — Z01419 Encounter for gynecological examination (general) (routine) without abnormal findings: Secondary | ICD-10-CM

## 2013-04-13 NOTE — Patient Instructions (Signed)

## 2013-04-13 NOTE — Progress Notes (Signed)
Note reviewed, agree with plan.  Christabella Alvira, MD  

## 2013-04-13 NOTE — Progress Notes (Signed)
42 y.o. G41P1001 Divorced Caucasian Fe here for annual exam.  Not sexually active, no STD screening needed. Still battles constipation with taking iron for anemia. Has yearly labs at work. Sees PCP prn. No health issues today. No HSV 1 in past year.    Patient's last menstrual period was 08/06/2011.          Sexually active: no  The current method of family planning is status post hysterectomy.    Exercising: yes  running Smoker:  no  Health Maintenance: Pap:  02-14-11 neg MMG:  10/13 Colonoscopy:  none BMD:   none TDaP:  2010 Labs: none Self breast exam: not done   reports that she has never smoked. She does not have any smokeless tobacco history on file. She reports that she drinks alcohol. She reports that she does not use illicit drugs.  Past Medical History  Diagnosis Date  . Anemia   . GERD (gastroesophageal reflux disease)   . Headache(784.0)   . PONV (postoperative nausea and vomiting)   . 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 STD (sexually transmitted disease) 05/2008    HSV I - by C & S and IgG (labial)  . Fibroid     Past Surgical History  Procedure Laterality Date  . Cesarean section  1997  . Robotic assisted laparoscopic hysterectomy and salpingectomy  08/06/11    fibroids partial hysterectomy    Current Outpatient Prescriptions  Medication Sig Dispense Refill  . diflunisal (DOLOBID) 500 MG TABS tablet daily.      Marland Kitchen lidocaine (LIDODERM) 5 % as needed.      Marland Kitchen olopatadine (PATANOL) 0.1 % ophthalmic solution Place 2 drops into both eyes 2 (two) times daily.      Maxwell Caul Bicarbonate (ZEGERID) 20-1100 MG CAPS Take 2 capsules by mouth 2 (two) times daily.       Marland Kitchen tretinoin (RETIN-A) 0.025 % cream Apply 1 application topically at bedtime as needed.       No current facility-administered medications for this visit.    Family History  Problem Relation Age of Onset  . Hypertension Father   . Cancer Maternal Grandmother     tumor  on the brain?     ROS:  Pertinent items are noted in HPI.  Otherwise, a comprehensive ROS was negative.  Exam:   BP 98/62  Pulse 68  Resp 16  Ht 5\' 8"  (1.727 m)  Wt 144 lb (65.318 kg)  BMI 21.90 kg/m2  LMP 08/06/2011 Height: 5\' 8"  (172.7 cm)  Ht Readings from Last 3 Encounters:  04/13/13 5\' 8"  (1.727 m)  08/06/11 5\' 9"  (1.753 m)  08/06/11 5\' 9"  (1.753 m)    General appearance: alert, cooperative and appears stated age Head: Normocephalic, without obvious abnormality, atraumatic Neck: no adenopathy, supple, symmetrical, trachea midline and thyroid normal to inspection and palpation Lungs: clear to auscultation bilaterally Breasts: normal appearance, no masses or tenderness, No nipple retraction or dimpling, No nipple discharge or bleeding, No axillary or supraclavicular adenopathy Heart: regular rate and rhythm Abdomen: soft, non-tender; no masses,  no organomegaly Extremities: extremities normal, atraumatic, no cyanosis or edema Skin: Skin color, texture, turgor normal. No rashes or lesions Lymph nodes: Cervical, supraclavicular, and axillary nodes normal. No abnormal inguinal nodes palpated Neurologic: Grossly normal   Pelvic: External genitalia:  no lesions              Urethra:  normal appearing urethra with no masses, tenderness or lesions  Bartholin's and Skene's: normal                 Vagina: normal appearing vagina with normal color and discharge, no lesions              Cervix: absent              Pap taken: no Bimanual Exam:  Uterus:  uterus absent              Adnexa: normal adnexa and no mass, fullness, tenderness               Rectovaginal: Confirms               Anus:  normal sphincter tone, no lesions  A:  Well Woman with normal exam  Contraception  None needed S/P TAH for fibroids  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 Questions addressed  Pap smear as per guidelines   Mammogram yearly given info to schedule pap smear not taken  counseled on breast self exam, mammography screening, STD prevention, adequate intake of calcium and vitamin D, diet and exercise  return annually or prn  An After Visit Summary was printed and given to the patient.

## 2013-05-13 ENCOUNTER — Encounter: Payer: Self-pay | Admitting: Obstetrics and Gynecology

## 2013-05-25 ENCOUNTER — Other Ambulatory Visit: Payer: Self-pay | Admitting: Obstetrics and Gynecology

## 2013-05-25 MED ORDER — VALACYCLOVIR HCL 500 MG PO TABS: 500.0000 mg | ORAL_TABLET | Freq: Two times a day (BID) | ORAL | Status: DC

## 2013-06-01 ENCOUNTER — Other Ambulatory Visit: Payer: Self-pay | Admitting: Obstetrics and Gynecology

## 2013-06-01 MED ORDER — VALACYCLOVIR HCL 500 MG PO TABS: 500.0000 mg | ORAL_TABLET | Freq: Every day | ORAL | Status: DC

## 2014-04-05 ENCOUNTER — Encounter: Payer: Self-pay | Admitting: Certified Nurse Midwife

## 2014-04-16 ENCOUNTER — Ambulatory Visit (INDEPENDENT_AMBULATORY_CARE_PROVIDER_SITE_OTHER): Payer: BC Managed Care – PPO | Admitting: Certified Nurse Midwife

## 2014-04-16 ENCOUNTER — Encounter: Payer: Self-pay | Admitting: Certified Nurse Midwife

## 2014-04-16 VITALS — BP 98/60 | HR 64 | Resp 16 | Ht 68.0 in | Wt 146.0 lb

## 2014-04-16 DIAGNOSIS — Z01419 Encounter for gynecological examination (general) (routine) without abnormal findings: Secondary | ICD-10-CM

## 2014-04-16 NOTE — Patient Instructions (Signed)

## 2014-04-16 NOTE — Progress Notes (Signed)
43 y.o. G52P1001 Divorced Caucasian Fe here for annual exam. Still having mood issues with cycle even though no cycle with hysterectomy for fibroids. Probiotics working well for constipation. Recent fall on hardwood floor and injured elbows bilateral, no fractures. See Urgent care if needed. Lipid Profile at work normal. No health issues today.  Patient's last menstrual period was 08/06/2011.       Sexually active: No.  The current method of family planning is status post hysterectomy.    Exercising: Yes.    jog,eliptical,light weights Smoker:  no  Health Maintenance: Pap: 02-14-11 neg MMG:  03-30-14 density category c, birads category 1:neg Colonoscopy:  none BMD:   none TDaP:  2010 Labs: none Self breast exam:not done   reports that she has never smoked. She does not have any smokeless tobacco history on file. She reports that she drinks alcohol. She reports that she does not use illicit drugs.  Past Medical History  Diagnosis Date  . Anemia   . GERD (gastroesophageal reflux disease)   . Headache(784.0)   . PONV (postoperative nausea and vomiting)   . 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 STD (sexually transmitted disease) 05/2008    HSV I - by C & S and IgG (labial)  . Fibroid     Past Surgical History  Procedure Laterality Date  . Cesarean section  1997  . Robotic assisted laparoscopic hysterectomy and salpingectomy  08/06/11    fibroids partial hysterectomy    Current Outpatient Prescriptions  Medication Sig Dispense Refill  . diflunisal (DOLOBID) 500 MG TABS tablet daily.    Marland Kitchen lidocaine (LIDODERM) 5 % as needed.    Marland Kitchen olopatadine (PATANOL) 0.1 % ophthalmic solution Place 2 drops into both eyes 2 (two) times daily.    Earney Navy Bicarbonate (ZEGERID) 20-1100 MG CAPS Take 2 capsules by mouth 2 (two) times daily.     Marland Kitchen tretinoin (RETIN-A) 0.025 % cream Apply 1 application topically at bedtime as needed.    . valACYclovir (VALTREX) 500 MG tablet Take 1 tablet  (500 mg total) by mouth daily. 30 tablet 10   No current facility-administered medications for this visit.    Family History  Problem Relation Age of Onset  . Hypertension Father   . Cancer Maternal Grandmother     tumor  on the brain?    ROS:  Pertinent items are noted in HPI.  Otherwise, a comprehensive ROS was negative.  Exam:   LMP 08/06/2011    Ht Readings from Last 3 Encounters:  04/13/13 5\' 8"  (1.727 m)  08/06/11 5\' 9"  (1.753 m)  07/31/11 5\' 9"  (1.753 m)    General appearance: alert, cooperative and appears stated age Head: Normocephalic, without obvious abnormality, atraumatic Neck: no adenopathy, supple, symmetrical, trachea midline and thyroid normal to inspection and palpation Lungs: clear to auscultation bilaterally Breasts: normal appearance, no masses or tenderness, No nipple retraction or dimpling, No nipple discharge or bleeding, No axillary or supraclavicular adenopathy Heart: regular rate and rhythm Abdomen: soft, non-tender; no masses,  no organomegaly Extremities: extremities normal, atraumatic, no cyanosis or edema Skin: Skin color, texture, turgor normal. No rashes or lesions Lymph nodes: Cervical, supraclavicular, and axillary nodes normal. No abnormal inguinal nodes palpated Neurologic: Grossly normal   Pelvic: External genitalia:  no lesions              Urethra:  normal appearing urethra with no masses, tenderness or lesions  Bartholin's and Skene's: normal                 Vagina: normal appearing vagina with normal color and discharge, no lesions              Cervix: absent              Pap taken: No. Bimanual Exam:  Uterus:  uterus absent              Adnexa: normal adnexa and no mass, fullness, tenderness               Rectovaginal: Confirms               Anus:  normal sphincter tone, no lesions  A:  Well Woman with normal exam  S/P partial hysterectomy for fibroids  Recent fall with elbows injury  Request allergist  information ? Wheezing with exercise and out doors.  P:   Reviewed health and wellness pertinent to exam  Discussed safety issues with good shoes to prevent falls  Given information list of providers  Pap smear not taken today   counseled on breast self exam, mammography screening, STD prevention, adequate intake of calcium and vitamin D, diet and exercise  return annually or prn  An After Visit Summary was printed and given to the patient.

## 2014-04-18 NOTE — Progress Notes (Signed)
Reviewed personally.  M. Suzanne Ranon Coven, MD.  

## 2014-09-14 ENCOUNTER — Other Ambulatory Visit: Payer: Self-pay | Admitting: Obstetrics and Gynecology

## 2014-09-14 NOTE — Telephone Encounter (Signed)
Medication refill request: Valtrex 500 mg Last AEX:  04/16/14 DL Next AEX: 05/10/15 DL Last MMG (if hormonal medication request): 03/30/14 [id number] Refill authorized: 05/12/13 #30/10R. Today #30/5R?

## 2015-02-16 ENCOUNTER — Encounter: Payer: Self-pay | Admitting: Certified Nurse Midwife

## 2015-02-16 ENCOUNTER — Telehealth: Payer: Self-pay | Admitting: Certified Nurse Midwife

## 2015-02-16 ENCOUNTER — Ambulatory Visit (INDEPENDENT_AMBULATORY_CARE_PROVIDER_SITE_OTHER): Payer: BLUE CROSS/BLUE SHIELD | Admitting: Certified Nurse Midwife

## 2015-02-16 VITALS — BP 110/64 | HR 68 | Temp 97.6°F | Resp 16 | Ht 68.0 in | Wt 140.0 lb

## 2015-02-16 DIAGNOSIS — N390 Urinary tract infection, site not specified: Secondary | ICD-10-CM

## 2015-02-16 DIAGNOSIS — R319 Hematuria, unspecified: Secondary | ICD-10-CM | POA: Diagnosis not present

## 2015-02-16 LAB — POCT URINALYSIS DIPSTICK
BILIRUBIN UA: NEGATIVE
Glucose, UA: NEGATIVE
KETONES UA: NEGATIVE
Nitrite, UA: NEGATIVE
PH UA: 5
PROTEIN UA: NEGATIVE
Urobilinogen, UA: NEGATIVE

## 2015-02-16 MED ORDER — PHENAZOPYRIDINE HCL 100 MG PO TABS: 100.0000 mg | ORAL_TABLET | Freq: Three times a day (TID) | ORAL | Status: DC | PRN

## 2015-02-16 MED ORDER — NITROFURANTOIN MONOHYD MACRO 100 MG PO CAPS: 100.0000 mg | ORAL_CAPSULE | Freq: Two times a day (BID) | ORAL | Status: DC

## 2015-02-16 NOTE — Telephone Encounter (Signed)
Patient has pressure and pain in her vaginal area.

## 2015-02-16 NOTE — Patient Instructions (Signed)

## 2015-02-16 NOTE — Progress Notes (Signed)
44 y.o.Divorced white female  g1p1001 here with complaint of UTI, with onset  on 3 days.. Patient complaining of urinary frequency/urgency/ and pain with urination. Feels like she is not emptying completely. Patient denies fever, chills, nausea or back pain. No new personal products. Patient feels not  to sexual activity. Denies any vaginal symptoms.  Patient drinking adequate water intake.   O: Healthy female WDWN Affect: Normal, orientation x 3 Skin : warm and dry CVAT: negative bilateral Abdomen: postive for suprapubic tenderness, soft,no masses  Pelvic exam: External genital area: normal, no lesions Bladder,Urethra tender, Urethral meatus: tender, red Vagina: normal vaginal discharge, normal appearance Cervix: absent Uterus:absent Adnexa: normal non tender, no fullness or masses   A: UTI Normal pelvic exam poct urine-rbc 1+, wbc 1+  P: Reviewed findings of UTI and need for treatment. [id number] see order with instructions Rx Pyridium see order [id number] Reviewed warning signs and symptoms of UTI and need to advise if occurring. Encouraged to limit soda, tea, and coffee and increase water intake. Patient encouraged to start on cranberry tablets to help with decreasing risk of UTI . Questions addressed.   RV prn

## 2015-02-16 NOTE — Telephone Encounter (Signed)
Spoke with patient. Patient states that 2 days ago she began to have pressure like she needs to void but when she attempts to urinate very little comes out. Has increased her water intake with no change. Denies burning with urination, lower back pain, fevers, or chills. Advised will need to be seen in office for further evaluation. Patient is agreeable. Appointment scheduled for today at 10 am with Melvia Heaps CNM. Agreeable to date and time.  Routing to provider for final review. Patient agreeable to disposition. Will close encounter.

## 2015-02-16 NOTE — Progress Notes (Signed)
Reviewed personally.  M. Suzanne Ludia Gartland, MD.  

## 2015-02-17 LAB — URINALYSIS, MICROSCOPIC ONLY
CASTS: NONE SEEN [LPF]
CRYSTALS: NONE SEEN [HPF]
YEAST: NONE SEEN [HPF]

## 2015-02-22 ENCOUNTER — Telehealth: Payer: Self-pay | Admitting: Certified Nurse Midwife

## 2015-02-22 MED ORDER — CIPROFLOXACIN HCL 500 MG PO TABS: 500.0000 mg | ORAL_TABLET | Freq: Two times a day (BID) | ORAL | Status: DC

## 2015-02-22 NOTE — Telephone Encounter (Signed)
Yes OK to send in RX. For Diflucan

## 2015-02-22 NOTE — Telephone Encounter (Signed)
Patient is still feeling pressure but not painful.

## 2015-02-22 NOTE — Telephone Encounter (Signed)
We do not have a urine culture from that DOS 02/16/15.  So she may not be on correct antibiotic. So lets change her to Cipro 500 mg BID # 14. Then TOC with urine culture in 2 weeks with a nurse visit.  She can also take OTC AZO prn.

## 2015-02-22 NOTE — Telephone Encounter (Signed)
Spoke with patient. Patient was seen on 02/16/2015 with Melvia Heaps CNM for UTI symptoms. Was started on Macrobid BID for 7 days. States on Saturday she had intercourse and had a lot of pressure. Pressure has continued. Has one more dose of Macrobid left. Denies any urinary symptoms, lower back pain, fevers, or chills. Advised I will speak with the covering provider and return call. Patient is agreeable.

## 2015-02-22 NOTE — Telephone Encounter (Signed)
Spoke with patient. Advised of message as seen below from Kem Boroughs, [place]. Patient is agreeable and verbalizes understanding. New prescription for Cipro 500 mg BID #14 sent to pharmacy on file. TOC appointment scheduled for 03/09/2015 at 9 am. Patient is requesting an rx for Diflucan. "I usually always get a yeast infection when I am treated with an antibiotic." Advised I will speak with Kem Boroughs, FNP and return call. Patient is agreeable.  Kem Boroughs, FNP okay to send in Diflucan 150 mg #2 0RF?

## 2015-02-23 MED ORDER — FLUCONAZOLE 150 MG PO TABS: 150.0000 mg | ORAL_TABLET | Freq: Once | ORAL | Status: DC

## 2015-02-23 NOTE — Telephone Encounter (Signed)
Spoke with patient.Rx for Diflucan 150 mg x2 sent to pharmacy on file. Patient agreeable.  Routing to provider for final review. Patient agreeable to disposition. Will close encounter.

## 2015-03-08 ENCOUNTER — Telehealth: Payer: Self-pay | Admitting: Certified Nurse Midwife

## 2015-03-08 NOTE — Telephone Encounter (Signed)
Patient has a urine toc appointment tomorrow and a question for a nurse.

## 2015-03-08 NOTE — Telephone Encounter (Signed)
Spoke with patient. Patient states that she has a "really bad sinus infection." Went to the doctor and was placed on antibiotics. Has a Urine TOC scheduled for tomorrow. Denies any current urinary symptoms. Would like to cancel her urine TOC at this time. Will return call if symptoms return.  Routing to provider for final review. Patient agreeable to disposition. Will close encounter.

## 2015-03-09 ENCOUNTER — Ambulatory Visit: Payer: BLUE CROSS/BLUE SHIELD

## 2015-05-02 ENCOUNTER — Encounter: Payer: Self-pay | Admitting: Obstetrics & Gynecology

## 2015-05-02 ENCOUNTER — Ambulatory Visit (INDEPENDENT_AMBULATORY_CARE_PROVIDER_SITE_OTHER): Payer: BLUE CROSS/BLUE SHIELD | Admitting: Obstetrics & Gynecology

## 2015-05-02 VITALS — BP 138/84 | HR 60 | Temp 97.6°F | Resp 16 | Wt 143.0 lb

## 2015-05-02 DIAGNOSIS — R319 Hematuria, unspecified: Secondary | ICD-10-CM

## 2015-05-02 DIAGNOSIS — N390 Urinary tract infection, site not specified: Secondary | ICD-10-CM

## 2015-05-02 LAB — URINALYSIS, MICROSCOPIC ONLY
Casts: NONE SEEN [LPF]
Crystals: NONE SEEN [HPF]
Yeast: NONE SEEN [HPF]

## 2015-05-02 LAB — POCT URINALYSIS DIPSTICK
Bilirubin, UA: NEGATIVE
Glucose, UA: NEGATIVE
Ketones, UA: NEGATIVE
Nitrite, UA: POSITIVE
UROBILINOGEN UA: NEGATIVE
pH, UA: 5

## 2015-05-02 MED ORDER — SULFAMETHOXAZOLE-TRIMETHOPRIM 800-160 MG PO TABS: 1.0000 | ORAL_TABLET | Freq: Two times a day (BID) | ORAL | Status: DC

## 2015-05-02 MED ORDER — PHENAZOPYRIDINE HCL 100 MG PO TABS: 100.0000 mg | ORAL_TABLET | Freq: Three times a day (TID) | ORAL | Status: DC | PRN

## 2015-05-02 NOTE — Progress Notes (Signed)
Subjective:     Patient ID: Jillian Francis, female   DOB: [birth year], 44 y.o.   MRN: [id number]  HPI 44 yo G1P1 MWF here for two day complaint of increased dysuria and increased frequency.  Denies blood in urine.  Pt had similar symptoms in September.  Culture was note done but pt needed macrobid and then ciprofloxin for symptoms to resolve.  Pt had no back pain and no fevers.    Pt has new sex partner this year.  This is third UTI she's had to treat since September.  Suppressive therapy discussed.  Pt would like to initiate this after current UTI is treated.    Denies STD concerns and declines STD testing today.  Denies vaginal discharge or odor.  Review of Systems  All other systems reviewed and are negative.      Objective:   Physical Exam  Constitutional: She is oriented to person, place, and time. She appears well-developed and well-nourished.  Abdominal: Soft. Bowel sounds are normal. There is tenderness (suprapubic).  Genitourinary: Vagina normal. There is no rash, tenderness, lesion or injury on the right labia. There is no rash, tenderness, lesion or injury on the left labia. No vaginal discharge found.  Cervix and uterus surgically absent.  Lymphadenopathy:       Right: No inguinal adenopathy present.       Left: No inguinal adenopathy present.  Neurological: She is alert and oriented to person, place, and time.  Skin: Skin is warm and dry.  Psychiatric: She has a normal mood and affect.  No CVA tenderness     Assessment:     Recurrent UTI  S/P Robotic TLH with bilateral salpingectomy    Plan:     Urine micro and culture pending Bactrim DS bid x 5 days.   Pyridium 100mg  TID prn dysuria/pressure.  Knows not to take longer then three days. Depending on culture results, consider suppressive therapy for UTI

## 2015-05-04 ENCOUNTER — Telehealth: Payer: Self-pay

## 2015-05-04 LAB — URINE CULTURE: Colony Count: >=100000

## 2015-05-04 NOTE — Telephone Encounter (Signed)
-----   Message from Megan Salon, MD sent at 05/04/2015 10:58 AM EST ----- Please inform pt that urine culture shows e coli.  She is on correct antibiotic.  I want her to finish this antibiotic and then repeat her culture as this is third UTI in less than three months.  I want to make sure this is negative.  Then I will start suppressive therapy if culture is negative.  Order placed.

## 2015-05-04 NOTE — Telephone Encounter (Signed)
Spoke with patient. Advised of message as seen below from [place]. Patient is agreeable and verbalizes understanding. Patient will complete antibiotics on 05/06/2015. Patient has an appointment for an aex on 05/10/2015. Advised urine can be rechecked at that appointment. Patient is agreeable and verbalizes understanding.  Routing to provider for final review. Patient agreeable to disposition. Will close encounter.

## 2015-05-04 NOTE — Addendum Note (Signed)
Addended by: Megan Salon on: 05/04/2015 10:59 AM   Modules accepted: Orders

## 2015-05-10 ENCOUNTER — Ambulatory Visit (INDEPENDENT_AMBULATORY_CARE_PROVIDER_SITE_OTHER): Payer: BLUE CROSS/BLUE SHIELD | Admitting: Certified Nurse Midwife

## 2015-05-10 ENCOUNTER — Encounter: Payer: Self-pay | Admitting: Certified Nurse Midwife

## 2015-05-10 VITALS — BP 120/70 | HR 70 | Resp 16 | Ht 68.0 in | Wt 144.0 lb

## 2015-05-10 DIAGNOSIS — N390 Urinary tract infection, site not specified: Secondary | ICD-10-CM | POA: Diagnosis not present

## 2015-05-10 DIAGNOSIS — R319 Hematuria, unspecified: Secondary | ICD-10-CM | POA: Diagnosis not present

## 2015-05-10 DIAGNOSIS — B009 Herpesviral infection, unspecified: Secondary | ICD-10-CM

## 2015-05-10 DIAGNOSIS — Z87440 Personal history of urinary (tract) infections: Secondary | ICD-10-CM | POA: Diagnosis not present

## 2015-05-10 DIAGNOSIS — Z0000 Encounter for general adult medical examination without abnormal findings: Secondary | ICD-10-CM | POA: Diagnosis not present

## 2015-05-10 DIAGNOSIS — Z01419 Encounter for gynecological examination (general) (routine) without abnormal findings: Secondary | ICD-10-CM | POA: Diagnosis not present

## 2015-05-10 LAB — POCT URINALYSIS DIPSTICK
BILIRUBIN UA: NEGATIVE
Blood, UA: NEGATIVE
GLUCOSE UA: NEGATIVE
KETONES UA: NEGATIVE
Leukocytes, UA: NEGATIVE
Nitrite, UA: NEGATIVE
Protein, UA: NEGATIVE
Urobilinogen, UA: NEGATIVE
pH, UA: 5

## 2015-05-10 LAB — COMPREHENSIVE METABOLIC PANEL
ALBUMIN: 3.8 g/dL (ref 3.6–5.1)
ALT: 13 U/L (ref 6–29)
AST: 17 U/L (ref 10–35)
Alkaline Phosphatase: 54 U/L (ref 33–115)
BUN: 15 mg/dL (ref 7–25)
CALCIUM: 8.8 mg/dL (ref 8.6–10.2)
CHLORIDE: 102 mmol/L (ref 98–110)
CO2: 29 mmol/L (ref 20–31)
CREATININE: 0.67 mg/dL (ref 0.50–1.10)
Glucose, Bld: 65 mg/dL (ref 65–99)
POTASSIUM: 4.5 mmol/L (ref 3.5–5.3)
Sodium: 136 mmol/L (ref 135–146)
TOTAL PROTEIN: 6.7 g/dL (ref 6.1–8.1)
Total Bilirubin: 0.8 mg/dL (ref 0.2–1.2)

## 2015-05-10 LAB — TSH: TSH: 1.497 u[IU]/mL (ref 0.350–4.500)

## 2015-05-10 LAB — VITAMIN D 25 HYDROXY (VIT D DEFICIENCY, FRACTURES): VIT D 25 HYDROXY: 48 ng/mL (ref 30–100)

## 2015-05-10 MED ORDER — VALACYCLOVIR HCL 500 MG PO TABS: ORAL_TABLET | ORAL | Status: DC

## 2015-05-10 NOTE — Progress Notes (Addendum)
44 y.o. G18P1001 Divorced  Caucasian Fe here for annual exam.  Has continued to have UTI after sexual activity. Will do urine culture today and if negative will do post coital medication as discussed. Continues to have HSV outbreaks but seldom, needs Rx refill. NO STD screening needed. Has only cholesterol screening with employment, desires other screening labs today. Sees Urgent care if needed. No health issues today. Daughter home soon for Christmas!  Patient's last menstrual period was 08/06/2011.          Sexually active: Yes.    The current method of family planning is status post hysterectomy.    Exercising: Yes.    running & weights Smoker:  no  Health Maintenance: Pap:  02-14-11 neg MMG:  03-30-14 density c, birads 1:neg scheduled 05/20/15 Colonoscopy:  none BMD:   none TDaP:  2010 Labs: poct urine-neg, Urine culture sent out for toc Self breast exam: not done   reports that she has never smoked. She does not have any smokeless tobacco history on file. She reports that she drinks about 1.2 oz of alcohol per week. She reports that she does not use illicit drugs.  Past Medical History  Diagnosis Date  . Anemia   . GERD (gastroesophageal reflux disease)   . Headache(784.0)   . PONV (postoperative nausea and vomiting)   . 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 STD (sexually transmitted disease) 05/2008    HSV I - by C & S and IgG (labial)  . Fibroid     Past Surgical History  Procedure Laterality Date  . Cesarean section  1997  . Robotic assisted laparoscopic hysterectomy and salpingectomy  08/06/11    fibroids partial hysterectomy    Current Outpatient Prescriptions  Medication Sig Dispense Refill  . olopatadine (PATANOL) 0.1 % ophthalmic solution Place 2 drops into both eyes 2 (two) times daily.    Earney Navy Bicarbonate (ZEGERID) 20-1100 MG CAPS Take 2 capsules by mouth 2 (two) times daily.     . Probiotic Product (PROBIOTIC PO) Take by mouth daily.    .  SUMAtriptan (IMITREX) 100 MG tablet     . valACYclovir (VALTREX) 500 MG tablet TAKE 1 TABLET (500 MG TOTAL) BY MOUTH DAILY. 30 tablet 5  . Pseudoephedrine-Ibuprofen (ADVIL COLD/SINUS PO) Take by mouth as needed.     No current facility-administered medications for this visit.    Family History  Problem Relation Age of Onset  . Hypertension Father   . Cancer Maternal Grandmother     tumor  on the brain?    ROS:  Pertinent items are noted in HPI.  Otherwise, a comprehensive ROS was negative.  Exam:   BP 120/70 mmHg  Pulse 70  Resp 16  Ht 5\' 8"  (1.727 m)  Wt 144 lb (65.318 kg)  BMI 21.90 kg/m2  LMP 08/06/2011 Height: 5\' 8"  (172.7 cm) Ht Readings from Last 3 Encounters:  05/10/15 5\' 8"  (1.727 m)  02/16/15 5\' 8"  (1.727 m)  04/16/14 5\' 8"  (1.727 m)    General appearance: alert, cooperative and appears stated age Head: Normocephalic, without obvious abnormality, atraumatic Neck: no adenopathy, supple, symmetrical, trachea midline and thyroid normal to inspection and palpation Lungs: clear to auscultation bilaterally Breasts: normal appearance, no masses or tenderness, No nipple retraction or dimpling, No nipple discharge or bleeding, No axillary or supraclavicular adenopathy Heart: regular rate and rhythm Abdomen: soft, non-tender; no masses,  no organomegaly Extremities: extremities normal, atraumatic, no cyanosis or edema  Skin: Skin color, texture, turgor normal. No rashes or lesions Lymph nodes: Cervical, supraclavicular, and axillary nodes normal. No abnormal inguinal nodes palpated Neurologic: Grossly normal   Pelvic: External genitalia:  no lesions              Urethra:  normal appearing urethra with no masses, tenderness or lesions              Bartholin's and Skene's: normal                 Vagina: normal appearing vagina with normal color and discharge, no lesions              Cervix: absent              Pap taken: No. Bimanual Exam:  Uterus:  uterus absent               Adnexa: normal adnexa and no mass, fullness, tenderness               Rectovaginal: Confirms               Anus:  normal sphincter tone, no lesions  Chaperone present: yes  A:  Well Woman with normal exam  Contraception none needed. S/P LVAH for fibroids with ovaries retained  History of frequent UTI post coital  History of HSV 2   Screening labs  P:   Reviewed health and wellness pertinent to exam  If culture is negative will do Macrobid Rx post coital   Lab: Urine culture  Rx Valtrex see order  Labs Vitamin D, CMP, TSH  Pap smear as above not taken   counseled on breast self exam, mammography screening, STD prevention, adequate intake of calcium and vitamin D, diet and exercise  return annually or prn  An After Visit Summary was printed and given to the patient.

## 2015-05-10 NOTE — Patient Instructions (Signed)

## 2015-05-11 ENCOUNTER — Other Ambulatory Visit: Payer: Self-pay | Admitting: Certified Nurse Midwife

## 2015-05-11 DIAGNOSIS — Z87440 Personal history of urinary (tract) infections: Secondary | ICD-10-CM

## 2015-05-11 LAB — URINE CULTURE
COLONY COUNT: NO GROWTH
Organism ID, Bacteria: NO GROWTH

## 2015-05-11 MED ORDER — NITROFURANTOIN MONOHYD MACRO 100 MG PO CAPS: ORAL_CAPSULE | ORAL | Status: DC

## 2015-05-11 NOTE — Progress Notes (Signed)
Reviewed personally.  M. Suzanne Tyreon Frigon, MD.  

## 2015-05-16 NOTE — Addendum Note (Signed)
Addended by: Megan Salon on: 05/16/2015 07:39 AM   Modules accepted: Miquel Dunn

## 2016-01-02 ENCOUNTER — Encounter: Payer: Self-pay | Admitting: Nurse Practitioner

## 2016-01-02 ENCOUNTER — Ambulatory Visit (INDEPENDENT_AMBULATORY_CARE_PROVIDER_SITE_OTHER): Payer: BLUE CROSS/BLUE SHIELD | Admitting: Nurse Practitioner

## 2016-01-02 VITALS — BP 124/70 | HR 60 | Temp 98.0°F | Ht 68.0 in | Wt 141.0 lb

## 2016-01-02 DIAGNOSIS — N760 Acute vaginitis: Secondary | ICD-10-CM

## 2016-01-02 DIAGNOSIS — N390 Urinary tract infection, site not specified: Secondary | ICD-10-CM

## 2016-01-02 DIAGNOSIS — R319 Hematuria, unspecified: Secondary | ICD-10-CM

## 2016-01-02 MED ORDER — FLUCONAZOLE 150 MG PO TABS: 150.0000 mg | ORAL_TABLET | Freq: Once | ORAL | 1 refills | Status: AC

## 2016-01-02 MED ORDER — CIPROFLOXACIN HCL 500 MG PO TABS: 500.0000 mg | ORAL_TABLET | Freq: Two times a day (BID) | ORAL | 0 refills | Status: DC

## 2016-01-02 MED ORDER — CIPROFLOXACIN HCL 500 MG PO TABS: ORAL_TABLET | ORAL | 0 refills | Status: DC

## 2016-01-02 NOTE — Progress Notes (Signed)
45 y.o.Divorced Caucasian female G1P1001 here with complaint of UTI, with onset over the weekend. Patient complaining of:  dysuria, urinary frequency, urinary urgency, pelvic pain and vaginal discharge. Patient denies fever, chills, nausea or back pain. No new personal products. Patient feels is to sexual activity. vaginal symptoms 1 week prior that was malodorous.    Contraception is post hysterectomy.  No change in partner of 15 months..  Patient usually has adequate water intake.  She does take Macrobid PC but failed to take this at last SA a week ago.   O: Healthy female WDWN Affect: Normal, orientation x 3 Skin : warm and dry CVAT: negative bilateral Abdomen: positive for suprapubic tenderness  Pelvic exam: External genital area: normal, no lesions Bladder,Urethra tender, Urethral meatus: normal Vagina: normal vaginal discharge, white to clear appearance Affirm is done Cervix: absent Uterus: absent Adnexa: normal non tender, no fullness or masses  POCT:  Unable to test secondary to Uricalm  A:  R/O UTI  Vaginitis  P: Reviewed findings of UTI and need for treatment. Rx:  Cipro 500 mg BID # 14 Rx:  Given Diflucan in case of yeast after antibiotics [id number] micro, culture, and Affirm Reviewed warning signs and symptoms of UTI and need to advise if occurring. Encouraged to limit soda, tea, and coffee   RV prn

## 2016-01-02 NOTE — Progress Notes (Signed)
Reviewed personally.  M. Suzanne Jaide Hillenburg, MD.  

## 2016-01-02 NOTE — Patient Instructions (Signed)
Pregnancy and Urinary Tract Infection  A urinary tract infection (UTI) is a bacterial infection of the urinary tract. Infection of the urinary tract can include the ureters, kidneys (pyelonephritis), bladder (cystitis), and urethra (urethritis). All pregnant women should be screened for bacteria in the urinary tract. Identifying and treating a UTI will decrease the risk of preterm labor and developing more serious infections in both the mother and baby.  CAUSES  Bacteria germs cause almost all UTIs.   RISK FACTORS  Many factors can increase your chances of getting a UTI during pregnancy. These include:  · Having a short urethra.  · Poor toilet and hygiene habits.  · Sexual intercourse.  · Blockage of urine along the urinary tract.  · Problems with the pelvic muscles or nerves.  · Diabetes.  · Obesity.  · Bladder problems after having several children.  · Previous history of UTI.  SIGNS AND SYMPTOMS   · Pain, burning, or a stinging feeling when urinating.  · Suddenly feeling the need to urinate right away (urgency).  · Loss of bladder control (urinary incontinence).  · Frequent urination, more than is common with pregnancy.  · Lower abdominal or back discomfort.  · Cloudy urine.  · Blood in the urine (hematuria).  · Fever.   When the kidneys are infected, the symptoms may be:  · Back pain.  · Flank pain on the right side more so than the left.  · Fever.  · Chills.  · Nausea.  · Vomiting.  DIAGNOSIS   A urinary tract infection is usually diagnosed through urine tests. Additional tests and procedures are sometimes done. These may include:  · Ultrasound exam of the kidneys, ureters, bladder, and urethra.  · Looking in the bladder with a lighted tube (cystoscopy).  TREATMENT  Typically, UTIs can be treated with antibiotic medicines.   HOME CARE INSTRUCTIONS   · Only take over-the-counter or prescription medicines as directed by your health care provider. If you were prescribed antibiotics, take them as directed. Finish  them even if you start to feel better.  · Drink enough fluids to keep your urine clear or pale yellow.  · Do not have sexual intercourse until the infection is gone and your health care provider says it is okay.  · Make sure you are tested for UTIs throughout your pregnancy. These infections often come back.   Preventing a UTI in the Future  · Practice good toilet habits. Always wipe from front to back. Use the tissue only once.  · Do not hold your urine. Empty your bladder as soon as possible when the urge comes.  · Do not douche or use deodorant sprays.  · Wash with soap and warm water around the genital area and the anus.  · Empty your bladder before and after sexual intercourse.  · Wear underwear with a cotton crotch.  · Avoid caffeine and carbonated drinks. They can irritate the bladder.  · Drink cranberry juice or take cranberry pills. This may decrease the risk of getting a UTI.  · Do not drink alcohol.  · Keep all your appointments and tests as scheduled.   SEEK MEDICAL CARE IF:   · Your symptoms get worse.  · You are still having fevers 2 or more days after treatment begins.  · You have a rash.  · You feel that you are having problems with medicines prescribed.  · You have abnormal vaginal discharge.  SEEK IMMEDIATE MEDICAL CARE IF:   · You have back or flank   pain.  · You have chills.  · You have blood in your urine.  · You have nausea and vomiting.  · You have contractions of your uterus.  · You have a gush of fluid from the vagina.  MAKE SURE YOU:  · Understand these instructions.    · Will watch your condition.    · Will get help right away if you are not doing well or get worse.       This information is not intended to replace advice given to you by your health care provider. Make sure you discuss any questions you have with your health care provider.     Document Released: 09/15/2010 Document Revised: 03/11/2013 Document Reviewed: 12/18/2012  Elsevier Interactive Patient Education ©2016 Elsevier  Inc.

## 2016-01-03 ENCOUNTER — Other Ambulatory Visit: Payer: Self-pay | Admitting: Nurse Practitioner

## 2016-01-03 LAB — URINALYSIS, MICROSCOPIC ONLY
Casts: NONE SEEN [LPF]
Crystals: NONE SEEN [HPF]
RBC / HPF: NONE SEEN RBC/HPF (ref <=2)
Squamous Epithelial / LPF: NONE SEEN [HPF] (ref <=5)
YEAST: NONE SEEN [HPF]

## 2016-01-03 LAB — WET PREP BY MOLECULAR PROBE
CANDIDA SPECIES: NEGATIVE
Gardnerella vaginalis: POSITIVE — AB
TRICHOMONAS VAG: NEGATIVE

## 2016-01-03 MED ORDER — METRONIDAZOLE 0.75 % VA GEL: 1.0000 | Freq: Every day | VAGINAL | 0 refills | Status: DC

## 2016-01-05 ENCOUNTER — Other Ambulatory Visit: Payer: Self-pay | Admitting: Certified Nurse Midwife

## 2016-01-05 DIAGNOSIS — N390 Urinary tract infection, site not specified: Secondary | ICD-10-CM

## 2016-01-05 LAB — URINE CULTURE: Colony Count: >=100000

## 2016-01-05 MED ORDER — SULFAMETHOXAZOLE-TRIMETHOPRIM 800-160 MG PO TABS: 1.0000 | ORAL_TABLET | Freq: Two times a day (BID) | ORAL | 0 refills | Status: DC

## 2016-05-10 ENCOUNTER — Ambulatory Visit (INDEPENDENT_AMBULATORY_CARE_PROVIDER_SITE_OTHER): Payer: BLUE CROSS/BLUE SHIELD | Admitting: Certified Nurse Midwife

## 2016-05-10 ENCOUNTER — Encounter: Payer: Self-pay | Admitting: Certified Nurse Midwife

## 2016-05-10 VITALS — BP 110/64 | HR 68 | Resp 16 | Ht 67.75 in | Wt 137.0 lb

## 2016-05-10 DIAGNOSIS — B009 Herpesviral infection, unspecified: Secondary | ICD-10-CM

## 2016-05-10 DIAGNOSIS — Z87440 Personal history of urinary (tract) infections: Secondary | ICD-10-CM | POA: Diagnosis not present

## 2016-05-10 DIAGNOSIS — Z01419 Encounter for gynecological examination (general) (routine) without abnormal findings: Secondary | ICD-10-CM | POA: Diagnosis not present

## 2016-05-10 DIAGNOSIS — Z0000 Encounter for general adult medical examination without abnormal findings: Secondary | ICD-10-CM | POA: Diagnosis not present

## 2016-05-10 LAB — POCT URINALYSIS DIPSTICK
BILIRUBIN UA: NEGATIVE
GLUCOSE UA: NEGATIVE
Ketones, UA: NEGATIVE
Leukocytes, UA: NEGATIVE
Nitrite, UA: NEGATIVE
Protein, UA: NEGATIVE
RBC UA: NEGATIVE
Urobilinogen, UA: NEGATIVE
pH, UA: 5

## 2016-05-10 MED ORDER — NITROFURANTOIN MONOHYD MACRO 100 MG PO CAPS: ORAL_CAPSULE | ORAL | 4 refills | Status: DC

## 2016-05-10 MED ORDER — VALACYCLOVIR HCL 500 MG PO TABS: ORAL_TABLET | ORAL | 12 refills | Status: DC

## 2016-05-10 NOTE — Progress Notes (Signed)
45 y.o. G43P1001 Divorced  Caucasian Fe here for annual exam. Still having PMS even though no uterus or bleeding. Sees Urgent care if needed. Sexually active no STD concerns. No more UTI's with Macrobid post coital use! No health issues today.  Patient's last menstrual period was 08/06/2011.          Sexually active: Yes.    The current method of family planning is status post hysterectomy.    Exercising: Yes.    weights & cardio Smoker:  no  Health Maintenance: Pap:  02-14-11 neg MMG:  05-16-15 category c density birads 1:neg Colonoscopy: none BMD:  none TDaP:  2010 Shingles: no Pneumonia: no Hep C and HIV: had done 76yrs ago Labs: poct urine-neg Self breast exam: done monthly   reports that she has never smoked. She has never used smokeless tobacco. She reports that she drinks about 1.2 - 1.8 oz of alcohol per week . She reports that she does not use drugs.  Past Medical History:  Diagnosis Date  . Anemia   . Fibroid   . GERD (gastroesophageal reflux disease)   . Headache(784.0)   . 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 PONV (postoperative nausea and vomiting)   . STD (sexually transmitted disease) 05/2008   HSV I - by C & S and IgG (labial)    Past Surgical History:  Procedure Laterality Date  . CESAREAN SECTION  1997  . ROBOTIC ASSISTED LAPAROSCOPIC HYSTERECTOMY AND SALPINGECTOMY  08/06/11   fibroids partial hysterectomy    Current Outpatient Prescriptions  Medication Sig Dispense Refill  . nitrofurantoin, macrocrystal-monohydrate, (MACROBID) 100 MG capsule Take one capsule after coitus, may repeat again in 12 hours if needed 30 capsule 3  . olopatadine (PATANOL) 0.1 % ophthalmic solution Place 2 drops into both eyes 2 (two) times daily.    . pantoprazole (PROTONIX) 40 MG tablet Take 1 tablet by mouth daily.    . Probiotic Product (PROBIOTIC PO) Take by mouth daily.    . Pseudoephedrine-Ibuprofen (ADVIL COLD/SINUS PO) Take by mouth as needed.    . SUMAtriptan  (IMITREX) 100 MG tablet     . valACYclovir (VALTREX) 500 MG tablet Bid x 3 days at onset of outbreaks 30 tablet 12   No current facility-administered medications for this visit.     Family History  Problem Relation Age of Onset  . Hypertension Father   . Cancer Maternal Grandmother     tumor  on the brain?    ROS:  Pertinent items are noted in HPI.  Otherwise, a comprehensive ROS was negative.  Exam:   BP 110/64   Pulse 68   Resp 16   Ht 5' 7.75" (1.721 m)   Wt 137 lb (62.1 kg)   LMP 08/06/2011   BMI 20.98 kg/m  Height: 5' 7.75" (172.1 cm) Ht Readings from Last 3 Encounters:  05/10/16 5' 7.75" (1.721 m)  01/02/16 5\' 8"  (1.727 m)  05/10/15 5\' 8"  (1.727 m)    General appearance: alert, cooperative and appears stated age Head: Normocephalic, without obvious abnormality, atraumatic Neck: no adenopathy, supple, symmetrical, trachea midline and thyroid normal to inspection and palpation Lungs: clear to auscultation bilaterally Breasts: normal appearance, no masses or tenderness, No nipple retraction or dimpling, No nipple discharge or bleeding, No axillary or supraclavicular adenopathy Heart: regular rate and rhythm Abdomen: soft, non-tender; no masses,  no organomegaly Extremities: extremities normal, atraumatic, no cyanosis or edema Skin: Skin color, texture, turgor normal. No rashes or  lesions Lymph nodes: Cervical, supraclavicular, and axillary nodes normal. No abnormal inguinal nodes palpated Neurologic: Grossly normal   Pelvic: External genitalia:  no lesions              Urethra:  normal appearing urethra with no masses, tenderness or lesions              Bartholin's and Skene's: normal                 Vagina: normal appearing vagina with normal color and discharge, no lesions              Cervix: absent              Pap taken: No. Bimanual Exam:  Uterus:  uterus absent              Adnexa: normal adnexa and no mass, fullness, tenderness                Rectovaginal: Confirms               Anus:  normal sphincter tone, no lesions  Chaperone present: yes  A:  Well Woman with normal exam  S/P TAH with ovaries retained due to fibroids  Post coital history, Macrobid working well, desires continuance  History of HSV needs update on Valtrex    P:   Reviewed health and wellness pertinent to exam  Rx Macrobid see order  Rx Valtrex see order  Pap smear as above not taken   counseled on breast self exam, mammography screening, adequate intake of calcium and vitamin D, diet and exercise  return annually or prn  An After Visit Summary was printed and given to the patient.

## 2016-05-10 NOTE — Patient Instructions (Signed)

## 2016-05-10 NOTE — Progress Notes (Signed)
Encounter reviewed Walker Paddack, MD   

## 2016-05-11 ENCOUNTER — Ambulatory Visit: Payer: BLUE CROSS/BLUE SHIELD | Admitting: Certified Nurse Midwife

## 2016-07-07 ENCOUNTER — Other Ambulatory Visit: Payer: Self-pay | Admitting: Certified Nurse Midwife

## 2016-07-07 DIAGNOSIS — Z87440 Personal history of urinary (tract) infections: Secondary | ICD-10-CM

## 2016-07-09 NOTE — Telephone Encounter (Signed)
Medication refill request: Macrobid   Last AEX:  05-10-16  Next AEX: 05-15-17  Last MMG (if hormonal medication request): 05-16-15 WNL  Refill authorized: please advise

## 2016-07-11 ENCOUNTER — Encounter: Payer: Self-pay | Admitting: Certified Nurse Midwife

## 2016-07-16 ENCOUNTER — Telehealth: Payer: Self-pay | Admitting: *Deleted

## 2016-07-16 ENCOUNTER — Ambulatory Visit (INDEPENDENT_AMBULATORY_CARE_PROVIDER_SITE_OTHER): Payer: BLUE CROSS/BLUE SHIELD | Admitting: Nurse Practitioner

## 2016-07-16 ENCOUNTER — Telehealth: Payer: Self-pay | Admitting: Certified Nurse Midwife

## 2016-07-16 ENCOUNTER — Encounter: Payer: Self-pay | Admitting: Nurse Practitioner

## 2016-07-16 VITALS — BP 124/76 | HR 80 | Temp 98.2°F | Resp 18 | Ht 67.75 in | Wt 136.0 lb

## 2016-07-16 DIAGNOSIS — R102 Pelvic and perineal pain: Secondary | ICD-10-CM

## 2016-07-16 LAB — POCT URINALYSIS DIPSTICK
Bilirubin, UA: NEGATIVE
Blood, UA: NEGATIVE
Glucose, UA: NEGATIVE
KETONES UA: NEGATIVE
LEUKOCYTES UA: NEGATIVE
Nitrite, UA: NEGATIVE
PH UA: 5
PROTEIN UA: NEGATIVE
UROBILINOGEN UA: NEGATIVE

## 2016-07-16 LAB — CBC
HCT: 40.8 % (ref 35.0–45.0)
Hemoglobin: 13.3 g/dL (ref 11.7–15.5)
MCH: 30.8 pg (ref 27.0–33.0)
MCHC: 32.6 g/dL (ref 32.0–36.0)
MCV: 94.4 fL (ref 80.0–100.0)
MPV: 9.8 fL (ref 7.5–12.5)
PLATELETS: 282 10*3/uL (ref 140–400)
RBC: 4.32 MIL/uL (ref 3.80–5.10)
RDW: 12.7 % (ref 11.0–15.0)
WBC: 7.2 10*3/uL (ref 3.8–10.8)

## 2016-07-16 NOTE — Progress Notes (Signed)
46 y.o. Divorced White female   [id number] here for complaint of pelvic pain.  Pain started on Saturday 07/14/16 as she was out to dinner.  She had sudden onset of sharp right sided pain with nausea without vomiting.  Pain lasted a few minutes. No pain of Sunday 07/15/16.  Woke up this am and pain had returned.  Pain is aggravated with standing and is located in the right lower pelvic area.  Pain is primarily located RLQ and is described as intermittent, burning, dull and aching.  Pain is aggravated by standing and is associated with bloating/abdominal distension and nausea.  She has tried the following treatments improvement in pain  . Denies any vomiting, fever, chills, or change in BM. Reports having a history of ovarian cysts prior to having her partial hysterectomy in 2013 done for fibroids.  Patient is sexually active with same partner X 2 yrs with last sexual encounter 3 days ago.    ROS:  Nausea:  Yes.    Fever:  No.  Weight loss/gain:  no  Vaginal discharge or odor:  no  Other:  Pain RLQ  All other ROS questions are negative except as per HPI.  Exam:   LMP 08/06/2011  General appearance: alert, cooperative, appears stated age and no distress CV:  nl S1 and S2 Lungs:clear to auscultation bilaterally Abdomen:  normal findings: bowel sounds normal, no masses palpable, no organomegaly and umbilicus normal Lymph:  Inguinal adenopathy: negative   Pelvic: External genitalia:  no lesions              Urethra: normal appearing urethra with no masses, tenderness or lesions              Bartholin's and Skene's: normal                 Vagina: normal appearing vagina with normal color and discharge, no lesions              Cervix: absent              Pap taken: No. Bimanual Exam:  Uterus:  surgically absent, vaginal cuff well healed                               Adnexa:    no masses and tenderness right                               Rectovaginal: not indicated                               Anus:   defer exam  Wet prep was not obtained.   A: prior pelvic surgery and prior ovarian cyst rupture      RLQ Pain  P: Labs:  STAT CBC     Radiology:  PUS is scheduled per Gay Filler     Medications:  None given     Patient will follow-up in 1 day for PUS.       An After Visit Summary was printed and given to the patient.

## 2016-07-16 NOTE — Telephone Encounter (Signed)
Stat CBC result called from [place] at [facility]. Fax report also received and called to Kem Boroughs FNP at [phone].   Routing to provider for final review.  Will close encounter.

## 2016-07-16 NOTE — Patient Instructions (Signed)
Will get Stat CBC and PUS tomorrow

## 2016-07-16 NOTE — Telephone Encounter (Signed)
Spoke with patient. Patient states that on Saturday 07/14/2016 she was out to dinner and developed sharp right sided pain and nausea. Pain lasted a few minutes without vomiting. No pain Sunday 07/15/2016. Woke up this morning and pain has returned. Pain is mainly with standing and is in the right lower pelvic area. Patient has nausea with standing. Denies any vomiting, fever, chills, or change in BM. Reports having a history of ovarian cysts prior to having her partial hysterectomy in 2013. Advised will need to be seen for further evaluation. Offered appointment today at 12:45 pm with Dr.Jertson, but patient declines. Offered appointment at 1 pm with Dr.Silva, patient declines. Appointment scheduled for today at 3 pm with Kem Boroughs, FNP. Patient is agreeable to date and time.  Routing to provider for final review. Patient agreeable to disposition. Will close encounter.

## 2016-07-16 NOTE — Telephone Encounter (Signed)
Patient thinks she may have a cyst.  States she was in a lot of pain over the weekend and got sick and now she is having the same feeling this morning.

## 2016-07-17 ENCOUNTER — Ambulatory Visit: Payer: BLUE CROSS/BLUE SHIELD | Admitting: Obstetrics and Gynecology

## 2016-07-17 ENCOUNTER — Encounter: Payer: Self-pay | Admitting: Obstetrics and Gynecology

## 2016-07-17 ENCOUNTER — Ambulatory Visit (INDEPENDENT_AMBULATORY_CARE_PROVIDER_SITE_OTHER): Payer: BLUE CROSS/BLUE SHIELD

## 2016-07-17 ENCOUNTER — Other Ambulatory Visit: Payer: Self-pay

## 2016-07-17 VITALS — BP 110/70 | HR 80 | Resp 16 | Wt 136.0 lb

## 2016-07-17 DIAGNOSIS — N83201 Unspecified ovarian cyst, right side: Secondary | ICD-10-CM | POA: Diagnosis not present

## 2016-07-17 DIAGNOSIS — R102 Pelvic and perineal pain: Secondary | ICD-10-CM

## 2016-07-17 NOTE — Progress Notes (Signed)
GYNECOLOGY  VISIT   HPI: 46 y.o.   Divorced  Caucasian  female   G1P1001 with Patient's last menstrual period was 08/06/2011 (exact date).   here for follow up pelvic pain. She has a h/o a hysterectomy, she was seen yesterday with a several day h/o intermittent, severe RLQ abdominal pain. Today she is feeling better.  CBC from yesterday shows a normal WBC and Hgb.   GYNECOLOGIC HISTORY: Patient's last menstrual period was 08/06/2011 (exact date). Contraception:hysterectomy  Menopausal hormone therapy: none         OB History    Gravida Para Term Preterm AB Living   1 1 1  0 0 1   SAB TAB Ectopic Multiple Live Births   0 0 0 0 1         Patient Active Problem List   Diagnosis Date Noted  . Urinary tract infectious disease 05/02/2015    Past Medical History:  Diagnosis Date  . Anemia   . Fibroid   . GERD (gastroesophageal reflux disease)   . Headache(784.0)   . 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 PONV (postoperative nausea and vomiting)   . STD (sexually transmitted disease) 05/2008   HSV I - by C & S and IgG (labial)    Past Surgical History:  Procedure Laterality Date  . CESAREAN SECTION  1997  . ROBOTIC ASSISTED LAPAROSCOPIC HYSTERECTOMY AND SALPINGECTOMY  08/06/11   fibroids partial hysterectomy    Current Outpatient Prescriptions  Medication Sig Dispense Refill  . nitrofurantoin, macrocrystal-monohydrate, (MACROBID) 100 MG capsule TAKE ONE CAPSULE AFTER COITUS, MAY REPEAT AGAIN IN 12 HOURS IF NEEDED 30 capsule 3  . olopatadine (PATANOL) 0.1 % ophthalmic solution Place 2 drops into both eyes 2 (two) times daily.    . pantoprazole (PROTONIX) 40 MG tablet Take 1 tablet by mouth daily.    . Probiotic Product (PROBIOTIC PO) Take by mouth daily.    . Pseudoephedrine-Ibuprofen (ADVIL COLD/SINUS PO) Take by mouth as needed.    . SUMAtriptan (IMITREX) 100 MG tablet     . valACYclovir (VALTREX) 500 MG tablet Bid x 3 days at onset of outbreaks 30 tablet 12   No  current facility-administered medications for this visit.      ALLERGIES: Morphine and related  Family History  Problem Relation Age of Onset  . Hypertension Father   . Cancer Maternal Grandmother     tumor  on the brain?    Social History   Social History  . Marital status: Divorced    Spouse name: N/A  . Number of children: 1  . Years of education: N/A   Occupational History  .  [employer]   Social History Main Topics  . Smoking status: Never Smoker  . Smokeless tobacco: Never Used  . Alcohol use 1.2 - 1.8 oz/week    2 - 3 Standard drinks or equivalent per week  . Drug use: No  . Sexual activity: Yes    Partners: Male    Birth control/ protection: Surgical     Comment: hysterectomy   Other Topics Concern  . Not on file   Social History Narrative  . No narrative on file    Review of Systems  Constitutional: Negative.   HENT: Negative.   Eyes: Negative.   Respiratory: Negative.   Cardiovascular: Negative.   Gastrointestinal: Negative.   Genitourinary: Negative.   Musculoskeletal: Negative.   Skin: Negative.   Neurological: Negative.   Endo/Heme/Allergies: Negative.  Psychiatric/Behavioral: Negative.     PHYSICAL EXAMINATION:    BP 110/70 (BP Location: Right Arm, Patient Position: Sitting, Cuff Size: Normal)   Pulse 80   Resp 16   Wt 136 lb (61.7 kg)   LMP 08/06/2011 (Exact Date)   BMI 20.83 kg/m     General appearance: alert, cooperative and appears stated age  Ultrasound images reviewed with the patient. She has a 4.4 cm complex cyst in her right ovary, most c/w a hemorrhagic corpus luteum cyst. No signs of torsion.   ASSESSMENT Right lower quadrant abdominal pain, improved today Complex right ovarian cyst, most c/w a hemorrhagic CL    PLAN Call with worsening pain F/U ultrasound in 2 months to confirm resolution   An After Visit Summary was printed and given to the patient.  CC: Edman Circle, NP

## 2016-07-17 NOTE — Patient Instructions (Signed)
Ovarian Cyst  An ovarian cyst is a fluid-filled sac that forms on an ovary. The ovaries are small organs that produce eggs in women. Various types of cysts can form on the ovaries. Some may cause symptoms and require treatment. Most ovarian cysts go away on their own, are not cancerous (are benign), and do not cause problems. Common types of ovarian cysts include:  Functional (follicle) cysts.  Occur during the menstrual cycle, and usually go away with the next menstrual cycle if you do not get pregnant.  Usually cause no symptoms.  Endometriomas.  Are cysts that form from the tissue that lines the uterus (endometrium).  Are sometimes called "chocolate cysts" because they become filled with blood that turns brown.  Can cause pain in the lower abdomen during intercourse and during your period.  Cystadenoma cysts.  Develop from cells on the outside surface of the ovary.  Can get very large and cause lower abdomen pain and pain with intercourse.  Can cause severe pain if they twist or break open (rupture).  Dermoid cysts.  Are sometimes found in both ovaries.  May contain different kinds of body tissue, such as skin, teeth, hair, or cartilage.  Usually do not cause symptoms unless they get very big.  Theca lutein cysts.  Occur when too much of a certain hormone (human chorionic gonadotropin) is produced and overstimulates the ovaries to produce an egg.  Are most common after having procedures used to assist with the conception of a baby (in vitro fertilization). What are the causes? Ovarian cysts may be caused by:  Ovarian hyperstimulation syndrome. This is a condition that can develop from taking fertility medicines. It causes multiple large ovarian cysts to form.  Polycystic ovarian syndrome (PCOS). This is a common hormonal disorder that can cause ovarian cysts, as well as problems with your period or fertility. What increases the risk? The following factors may make you  more likely to develop ovarian cysts:  Being overweight or obese.  Taking fertility medicines.  Taking certain forms of hormonal birth control.  Smoking. What are the signs or symptoms? Many ovarian cysts do not cause symptoms. If symptoms are present, they may include:  Pelvic pain or pressure.  Pain in the lower abdomen.  Pain during sex.  Abdominal swelling.  Abnormal menstrual periods.  Increasing pain with menstrual periods. How is this diagnosed? These cysts are commonly found during a routine pelvic exam. You may have tests to find out more about the cyst, such as:  Ultrasound.  X-ray of the pelvis.  CT scan.  MRI.  Blood tests. How is this treated? Many ovarian cysts go away on their own without treatment. Your health care provider may want to check your cyst regularly for 2-3 months to see if it changes. If you are in menopause, it is especially important to have your cyst monitored closely because menopausal women have a higher rate of ovarian cancer. When treatment is needed, it may include:  Medicines to help relieve pain.  A procedure to drain the cyst (aspiration).  Surgery to remove the whole cyst.  Hormone treatment or birth control pills. These methods are sometimes used to help dissolve a cyst. Follow these instructions at home:  Take over-the-counter and prescription medicines only as told by your health care provider.  Do not drive or use heavy machinery while taking prescription pain medicine.  Get regular pelvic exams and Pap tests as often as told by your health care provider.  Return to your   normal activities as told by your health care provider. Ask your health care provider what activities are safe for you.  Do not use any products that contain nicotine or tobacco, such as cigarettes and e-cigarettes. If you need help quitting, ask your health care provider.  Keep all follow-up visits as told by your health care provider. This is  important. Contact a health care provider if:  Your periods are late, irregular, or painful, or they stop.  You have pelvic pain that does not go away.  You have pressure on your bladder or trouble emptying your bladder completely.  You have pain during sex.  You have any of the following in your abdomen:  A feeling of fullness.  Pressure.  Discomfort.  Pain that does not go away.  Swelling.  You feel generally ill.  You become constipated.  You lose your appetite.  You develop severe acne.  You start to have more body hair and facial hair.  You are gaining weight or losing weight without changing your exercise and eating habits.  You think you may be pregnant. Get help right away if:  You have abdominal pain that is severe or gets worse.  You cannot eat or drink without vomiting.  You suddenly develop a fever.  Your menstrual period is much heavier than usual. This information is not intended to replace advice given to you by your health care provider. Make sure you discuss any questions you have with your health care provider. Document Released: 05/21/2005 Document Revised: 12/09/2015 Document Reviewed: 10/23/2015 Elsevier Interactive Patient Education  2017 Elsevier Inc.  

## 2016-07-17 NOTE — Progress Notes (Signed)
Encounter reviewed Devery Odwyer, MD   

## 2016-09-11 ENCOUNTER — Ambulatory Visit (INDEPENDENT_AMBULATORY_CARE_PROVIDER_SITE_OTHER): Payer: BLUE CROSS/BLUE SHIELD

## 2016-09-11 ENCOUNTER — Ambulatory Visit (INDEPENDENT_AMBULATORY_CARE_PROVIDER_SITE_OTHER): Payer: BLUE CROSS/BLUE SHIELD | Admitting: Obstetrics and Gynecology

## 2016-09-11 ENCOUNTER — Encounter: Payer: Self-pay | Admitting: Obstetrics and Gynecology

## 2016-09-11 VITALS — BP 120/70 | HR 72 | Resp 16 | Wt 135.0 lb

## 2016-09-11 DIAGNOSIS — N83201 Unspecified ovarian cyst, right side: Secondary | ICD-10-CM

## 2016-09-11 DIAGNOSIS — N83202 Unspecified ovarian cyst, left side: Secondary | ICD-10-CM | POA: Diagnosis not present

## 2016-09-11 NOTE — Progress Notes (Signed)
GYNECOLOGY  VISIT   HPI: 46 y.o.   Divorced  Caucasian  female   G1P1001 with Patient's last menstrual period was 08/06/2011 (exact date).   here for  Follow up pelvic pain. The patient had pelvic pain in 2/18 and was noted to have a complex 4.4 cm right ovarian cyst (suspected hemorrhagic CL). She is here for f/u. Her pain has resolved. She has a h/o a hysterectomy.   GYNECOLOGIC HISTORY: Patient's last menstrual period was 08/06/2011 (exact date). Contraception:hysterectomy  Menopausal hormone therapy: none         OB History    Gravida Para Term Preterm AB Living   1 1 1  0 0 1   SAB TAB Ectopic Multiple Live Births   0 0 0 0 1         Patient Active Problem List   Diagnosis Date Noted  . Urinary tract infectious disease 05/02/2015    Past Medical History:  Diagnosis Date  . Anemia   . Fibroid   . GERD (gastroesophageal reflux disease)   . Headache(784.0)   . 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 PONV (postoperative nausea and vomiting)   . STD (sexually transmitted disease) 05/2008   HSV I - by C & S and IgG (labial)    Past Surgical History:  Procedure Laterality Date  . CESAREAN SECTION  1997  . ROBOTIC ASSISTED LAPAROSCOPIC HYSTERECTOMY AND SALPINGECTOMY  08/06/11   fibroids partial hysterectomy    Current Outpatient Prescriptions  Medication Sig Dispense Refill  . nitrofurantoin, macrocrystal-monohydrate, (MACROBID) 100 MG capsule TAKE ONE CAPSULE AFTER COITUS, MAY REPEAT AGAIN IN 12 HOURS IF NEEDED 30 capsule 3  . olopatadine (PATANOL) 0.1 % ophthalmic solution Place 2 drops into both eyes 2 (two) times daily.    . pantoprazole (PROTONIX) 40 MG tablet Take 1 tablet by mouth daily.    . Probiotic Product (PROBIOTIC PO) Take by mouth daily.    . Pseudoephedrine-Ibuprofen (ADVIL COLD/SINUS PO) Take by mouth as needed.    . SUMAtriptan (IMITREX) 100 MG tablet     . valACYclovir (VALTREX) 500 MG tablet Bid x 3 days at onset of outbreaks 30 tablet 12   No  current facility-administered medications for this visit.      ALLERGIES: Morphine and related  Family History  Problem Relation Age of Onset  . Hypertension Father   . Cancer Maternal Grandmother     tumor  on the brain?    Social History   Social History  . Marital status: Divorced    Spouse name: N/A  . Number of children: 1  . Years of education: N/A   Occupational History  .  [employer]   Social History Main Topics  . Smoking status: Never Smoker  . Smokeless tobacco: Never Used  . Alcohol use 1.2 - 1.8 oz/week    2 - 3 Standard drinks or equivalent per week  . Drug use: No  . Sexual activity: Yes    Partners: Male    Birth control/ protection: Surgical     Comment: hysterectomy   Other Topics Concern  . Not on file   Social History Narrative  . No narrative on file    Review of Systems  Constitutional: Negative.   HENT: Negative.   Eyes: Negative.   Respiratory: Negative.   Cardiovascular: Negative.   Gastrointestinal: Negative.   Genitourinary: Negative.   Musculoskeletal: Negative.   Skin: Negative.   Neurological: Negative.  Endo/Heme/Allergies: Negative.   Psychiatric/Behavioral: Negative.     PHYSICAL EXAMINATION:    BP 120/70 (BP Location: Right Arm, Patient Position: Sitting, Cuff Size: Normal)   Pulse 72   Resp 16   Wt 135 lb (61.2 kg)   LMP 08/06/2011 (Exact Date)   BMI 20.68 kg/m     General appearance: alert, cooperative and appears stated age  Ultrasound images reviewed with the patient, her right ovarian cyst has resolved, now she has a small hemorrhagic appearing cyst on the left side (1.1 cm). Normal left ovary on her prior ultrasound  ASSESSMENT Right ovarian cyst has resolved New small hemorrhagic appearing left ovarian cyst. No symptoms, prior u/s normal which goes along with a hemorrhagic cyst    PLAN F/U as needed   An After Visit Summary was printed and given to the patient.  CC: Edman Circle, NP

## 2017-02-25 ENCOUNTER — Telehealth: Payer: Self-pay

## 2017-02-25 NOTE — Telephone Encounter (Signed)
Spoke with patient at time of incoming call. Patient states over the last year she has had many changes in her life. Her daughter went to college and she got a DWI. Has been feeling irritable, overwhelmed, and emotional since December 2017. Reports she feels like everything is a "big deal" even when it is a minor situation. "i just keep crying. I don't know what is wrong with me." Is not on current anxiety or depression medication. Does not have PCP. Denies SI/HI. Advised needs to be seen for evaluation. Offered appointment today, but patient declines. Only wants to see Melvia Heaps CNM. Reports she feels stable to wait until tomorrow. Appointment scheduled for 02/26/2017 at 10 am with Melvia Heaps CNM. Patient is agreeable to date and time. Advised if anything changes and she needs to be seen earlier may contact our office or be seen at a local urgent care or ER.  Routing to provider for final review. Patient agreeable to disposition. Will close encounter.

## 2017-02-26 ENCOUNTER — Encounter: Payer: Self-pay | Admitting: Certified Nurse Midwife

## 2017-02-26 ENCOUNTER — Ambulatory Visit (INDEPENDENT_AMBULATORY_CARE_PROVIDER_SITE_OTHER): Payer: BLUE CROSS/BLUE SHIELD | Admitting: Certified Nurse Midwife

## 2017-02-26 VITALS — BP 130/64 | HR 68 | Resp 16 | Ht 67.75 in | Wt 138.0 lb

## 2017-02-26 DIAGNOSIS — F419 Anxiety disorder, unspecified: Secondary | ICD-10-CM | POA: Diagnosis not present

## 2017-02-26 DIAGNOSIS — F32A Depression, unspecified: Secondary | ICD-10-CM

## 2017-02-26 DIAGNOSIS — F329 Major depressive disorder, single episode, unspecified: Secondary | ICD-10-CM

## 2017-02-26 NOTE — Progress Notes (Signed)
Subjective:     Patient ID: Jillian Francis, female   DOB: 01-15-1971, 46 y.o.   MRN: [id number]  46 yo g1 p1001 divorced white female here with complaint of loneliness and increase in anger. Patient is divorced and has raised daughter essentially herself. She also has another  stepdaughter with her second spouse who is 45.Oldest daughter  is at [employer] and does not want to come home now, Junior in college, busy with school. 46 y.o now allowed to visit by her mother. Patient misses this contact. She also has been with the same partner who has 2 sons who are teens and she enjoys visiting with them, but is only allowed to be with partner on weekend. She always goes there he never comes to her. Recently went out of town and did not invite her, but ask her to keep sons. 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   She received DWI after having wine at his house and now uncertain of how she feels about anything. Patient " I have been crying and angry ever since this happened and now crying at work". " Feel like I have just be used to facilitate a need". Patient has not called her friends, because they are all couples and drink when socializing with her."I don't need that". Patient is having some trouble sleeping, but tried Melatonin at this has worked well. Will be going to [employer] to see daughter this weekend, so looking forward to that. Her boss at work is sympathetic and letting her work as needed from home, but she had rather been around people. Has never been to counseling and not sure she needs this. "Do I need medication"? No thoughts of self harm or others. Eating good diet and exercises at times.       Review of Systems  Constitutional: Negative.   Cardiovascular: Negative.   Gastrointestinal: Negative.   Genitourinary: Negative.   Skin: Negative.   Neurological: Negative.   Psychiatric/Behavioral: Positive for sleep disturbance. Negative for suicidal ideas.     With crying and anger outburst. Using Melatonin with good results.       Objective:   Physical Exam: Appearance appropriately dressed, smiling at times with conversation and crying at times. Smiles with discussion of daughter. WNWD healthy appearing female.     Assessment:     Anxiety/depression with anger outbursts(but with normal reason, not exaggeration) Normal empty nest reaction    Plan:     Discussed the reasons for crying are real with her daughter growing up and becoming an adult and needs mother's guidance less. Discussed this a normal response and also sadness that comes with this is normal. She still is seeing and visiting daughter , but now in more of an adult way. Patient agrees she is very proud to have her doing so well. She is also happy to be with her this way. Encouraged patient to discuss with her daughter "mom is having a hard time with you growing up" and ways she can enjoy her as an adult. Patient feels this is a good option for her.(patient smiling and not crying now) Discussed relationship definition with current partner as she presented is that he admitted he was not committed, and she is feeling a need with his sons. Encouraged patient to consider if this is what she wants, because partner is not spending time with her. Patient feels she is being used and will need to process this. Encouraged to  think about is she really angry at herself ?? For allowing this to happen. Discussed medication use for anxiety and depression, risks/benefits/side effects and expectations. Also discussed using alcohol is self medicating. Patient feels medication is not for her. Discussed counseling to help her with changes with life and daughter. Patient will consider and given information, for [facility].  Discussed importance of seeking 911 or ER if thoughts of hurting self or others. Patient aware and will call. Encouraged to seek friends contact for support and to help with  being alone. Stressed importance to call if status change .  Time spent with patient in face to face consultation 40 minutes.  Rv prn

## 2017-05-14 ENCOUNTER — Ambulatory Visit: Payer: BLUE CROSS/BLUE SHIELD | Admitting: Certified Nurse Midwife

## 2017-05-15 ENCOUNTER — Other Ambulatory Visit (HOSPITAL_COMMUNITY)
Admission: RE | Admit: 2017-05-15 | Discharge: 2017-05-15 | Disposition: A | Discharge status: Home / Self Care | Payer: BLUE CROSS/BLUE SHIELD | Source: Ambulatory Visit | Attending: Certified Nurse Midwife | Admitting: Certified Nurse Midwife

## 2017-05-15 ENCOUNTER — Other Ambulatory Visit: Payer: Self-pay

## 2017-05-15 ENCOUNTER — Ambulatory Visit: Payer: BLUE CROSS/BLUE SHIELD | Admitting: Certified Nurse Midwife

## 2017-05-15 ENCOUNTER — Encounter: Payer: Self-pay | Admitting: Certified Nurse Midwife

## 2017-05-15 VITALS — BP 108/68 | HR 68 | Resp 16 | Wt 146.0 lb

## 2017-05-15 DIAGNOSIS — D649 Anemia, unspecified: Secondary | ICD-10-CM | POA: Diagnosis not present

## 2017-05-15 DIAGNOSIS — F418 Other specified anxiety disorders: Secondary | ICD-10-CM | POA: Diagnosis not present

## 2017-05-15 DIAGNOSIS — Z01419 Encounter for gynecological examination (general) (routine) without abnormal findings: Secondary | ICD-10-CM | POA: Diagnosis present

## 2017-05-15 DIAGNOSIS — Z124 Encounter for screening for malignant neoplasm of cervix: Secondary | ICD-10-CM

## 2017-05-15 DIAGNOSIS — B009 Herpesviral infection, unspecified: Secondary | ICD-10-CM

## 2017-05-15 DIAGNOSIS — K219 Gastro-esophageal reflux disease without esophagitis: Secondary | ICD-10-CM | POA: Diagnosis not present

## 2017-05-15 DIAGNOSIS — Z87440 Personal history of urinary (tract) infections: Secondary | ICD-10-CM | POA: Diagnosis not present

## 2017-05-15 MED ORDER — NITROFURANTOIN MONOHYD MACRO 100 MG PO CAPS: ORAL_CAPSULE | ORAL | 3 refills | Status: DC

## 2017-05-15 MED ORDER — VALACYCLOVIR HCL 500 MG PO TABS: ORAL_TABLET | ORAL | 12 refills | Status: DC

## 2017-05-15 NOTE — Progress Notes (Addendum)
46 y.o. G27P1001 Divorced  Caucasian Fe here for annual exam. Anxiety/depression is stable with self management and has a new friend who she can do things with. Daughters are enjoying each other. Feeling much better still has crying episodes, but has support with friend during this. 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 No other health issues today. Daughter coming home from college tomorrow!  Patient's last menstrual period was 08/06/2011 (exact date).          Sexually active: Yes.    The current method of family planning is status post hysterectomy.    Exercising: Yes.    cardio & light weights Smoker:  no  Health Maintenance: Pap:  02-14-11 neg History of Abnormal Pap: yes MMG:  06-26-16 category c density birads 1:neg Self Breast exams: yes Colonoscopy:  none BMD:   none TDaP:  2010 Shingles: no Pneumonia: no Hep C and HIV: HIV done 70yrs ago Labs: no   reports that  has never smoked. she has never used smokeless tobacco. She reports that she drinks alcohol. She reports that she does not use drugs.  Past Medical History:  Diagnosis Date  . Anemia   . Fibroid   . GERD (gastroesophageal reflux disease)   . Headache(784.0)   . 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 PONV (postoperative nausea and vomiting)   . STD (sexually transmitted disease) 05/2008   HSV I - by C & S and IgG (labial)    Past Surgical History:  Procedure Laterality Date  . CESAREAN SECTION  1997  . ROBOTIC ASSISTED LAPAROSCOPIC HYSTERECTOMY AND SALPINGECTOMY  08/06/11   fibroids partial hysterectomy    Current Outpatient Medications  Medication Sig Dispense Refill  . nitrofurantoin, macrocrystal-monohydrate, (MACROBID) 100 MG capsule TAKE ONE CAPSULE AFTER COITUS, MAY REPEAT AGAIN IN 12 HOURS IF NEEDED 30 capsule 3  . olopatadine (PATANOL) 0.1 % ophthalmic solution Place 2 drops into both eyes at bedtime.     Earney Navy Bicarbonate (ZEGERID PO) Take by mouth.    . pantoprazole (PROTONIX) 40  MG tablet Take 1 tablet by mouth daily.    . Probiotic Product (PROBIOTIC PO) Take by mouth daily.    . Pseudoephedrine-Ibuprofen (ADVIL COLD/SINUS PO) Take by mouth as needed.    . SUMAtriptan (IMITREX) 100 MG tablet     . valACYclovir (VALTREX) 500 MG tablet Bid x 3 days at onset of outbreaks 30 tablet 12   No current facility-administered medications for this visit.     Family History  Problem Relation Age of Onset  . Hypertension Father   . Cancer Maternal Grandmother        tumor  on the brain?    ROS:  Pertinent items are noted in HPI.  Otherwise, a comprehensive ROS was negative.  Exam:   BP 108/68   Pulse 68   Resp 16   Wt 146 lb (66.2 kg)   LMP 08/06/2011 (Exact Date)   BMI 22.36 kg/m    Ht Readings from Last 3 Encounters:  02/26/17 5' 7.75" (1.721 m)  07/16/16 5' 7.75" (1.721 m)  05/10/16 5' 7.75" (1.721 m)    General appearance: alert, cooperative and appears stated age Head: Normocephalic, without obvious abnormality, atraumatic Neck: no adenopathy, supple, symmetrical, trachea midline and thyroid normal to inspection and palpation Lungs: clear to auscultation bilaterally Breasts: normal appearance, no masses or tenderness, No nipple discharge or bleeding, No axillary or supraclavicular adenopathy Heart: regular rate and rhythm Abdomen: soft, non-tender;  no masses,  no organomegaly Extremities: extremities normal, atraumatic, no cyanosis or edema Skin: Skin color, texture, turgor normal. No rashes or lesions Lymph nodes: Cervical, supraclavicular, and axillary nodes normal. No abnormal inguinal nodes palpated Neurologic: Grossly normal   Pelvic: External genitalia:  no lesions              Urethra:  normal appearing urethra with no masses, tenderness or lesions              Bartholin's and Skene's: normal                 Vagina: normal appearing vagina with normal color and discharge, no lesions, small pigmented area noted in posterior fornix, pap smear  taken of area              Cervix: absent              Pap taken: Yes.   Bimanual Exam:  Uterus:  uterus absent              Adnexa: normal adnexa and no mass, fullness, tenderness               Rectovaginal: Confirms               Anus:  normal sphincter tone, no lesions  Chaperone present: yes  A:  Well Woman with normal exam  TAH for bleeding and history of abnormal pap smear with LEEP with ovaries retained  Depression/anxiety improving with being more social with friends now, no medication needed  History of Post coital UTI, Macrobid working well  History of HSV 1 on suppression  P:   Reviewed health and wellness pertinent to exam  Aware of need to evaluate if vaginal bleeding  Encouraged to continue to step outside her self and enjoy new relationships and will advise if concerns with depression occur.  Rx Macrobid see order with instructions  Rx Valtrex see order with instructions  Pap smear: yes   counseled on breast self exam, mammography screening, feminine hygiene, diet and exercise, vaginal dryness  return annually or prn  An After Visit Summary was printed and given to the patient.

## 2017-05-15 NOTE — Patient Instructions (Signed)

## 2017-05-17 LAB — CYTOLOGY - PAP
DIAGNOSIS: NEGATIVE
HPV: NOT DETECTED

## 2017-05-20 ENCOUNTER — Telehealth: Payer: Self-pay | Admitting: Certified Nurse Midwife

## 2017-05-20 NOTE — Telephone Encounter (Signed)
Please set her up for an appointment.

## 2017-05-20 NOTE — Telephone Encounter (Signed)
Spoke with patient, advised as seen below per Dr. Talbert Nan. OV scheduled for 12/18 at 11am with Dr. Talbert Nan. Patient verbalizes understanding and is agreeable.   Routing to provider for final review. Patient is agreeable to disposition. Will close encounter.  Cc: Melvia Heaps, CNM

## 2017-05-20 NOTE — Telephone Encounter (Signed)
Spoke with patient. 12/12 pap positive for yeast, tried OTC Monistat 3 day, made symptoms worse.   Reports increased vaginal itching and burning, denies d/c or odor.   Requesting Rx for treatment.   Advised Melvia Heaps, CNM is out of the office today, will review with covering provider and return call with recommendations, patient is agreeable.   Dr. Talbert Nan -please review and advise on RX?  Cc: Melvia Heaps, CNM

## 2017-05-20 NOTE — Telephone Encounter (Signed)
Patient called with concerns her yeast infection is getting worse and requested a call back from the nurse. She said the Monistat she tried is causing burning and increased itching.   Pharmacy on file is correct, if needed.

## 2017-05-21 ENCOUNTER — Other Ambulatory Visit: Payer: Self-pay | Admitting: Certified Nurse Midwife

## 2017-05-21 ENCOUNTER — Ambulatory Visit: Payer: BLUE CROSS/BLUE SHIELD | Admitting: Obstetrics and Gynecology

## 2017-05-21 ENCOUNTER — Other Ambulatory Visit: Payer: Self-pay

## 2017-05-21 ENCOUNTER — Encounter: Payer: Self-pay | Admitting: Obstetrics and Gynecology

## 2017-05-21 VITALS — BP 118/80 | HR 84 | Resp 14 | Wt 147.0 lb

## 2017-05-21 DIAGNOSIS — N898 Other specified noninflammatory disorders of vagina: Secondary | ICD-10-CM

## 2017-05-21 DIAGNOSIS — N760 Acute vaginitis: Secondary | ICD-10-CM

## 2017-05-21 DIAGNOSIS — B009 Herpesviral infection, unspecified: Secondary | ICD-10-CM

## 2017-05-21 MED ORDER — BETAMETHASONE VALERATE 0.1 % EX OINT: TOPICAL_OINTMENT | CUTANEOUS | 0 refills | Status: DC

## 2017-05-21 MED ORDER — FLUCONAZOLE 150 MG PO TABS: 150.0000 mg | ORAL_TABLET | Freq: Once | ORAL | 0 refills | Status: AC

## 2017-05-21 NOTE — Progress Notes (Signed)
GYNECOLOGY  VISIT   HPI: 46 y.o.   Divorced  Caucasian  female   G1P1001 with Patient's last menstrual period was 08/06/2011 (exact date).   here c/o vaginal itching and burning. Patient tried OTC Monistat with no relief.     The patient was noted to have yeast on her pap on 05/15/17. On 05/18/17 she started having vulvar burning and itching. She has self treated with the monistat. It burned some, she does feel a little better overall, but uncomfortable with insertion. She doesn't usually have burning with yeast infections.  On review of the chart, she was noted to have an area of darkening on her vaginal apex at her annual exam. A pap smear of the vaginal apex was negative for SIL.   GYNECOLOGIC HISTORY: Patient's last menstrual period was 08/06/2011 (exact date). Contraception:hysterectomy  Menopausal hormone therapy: none         OB History    Gravida Para Term Preterm AB Living   1 1 1  0 0 1   SAB TAB Ectopic Multiple Live Births   0 0 0 0 1         Patient Active Problem List   Diagnosis Date Noted  . Urinary tract infectious disease 05/02/2015    Past Medical History:  Diagnosis Date  . Abnormal Pap smear of cervix   . Anemia   . Fibroid   . GERD (gastroesophageal reflux disease)   . Headache(784.0)   . 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 PONV (postoperative nausea and vomiting)   . STD (sexually transmitted disease) 05/2008   HSV I - by C & S and IgG (labial)    Past Surgical History:  Procedure Laterality Date  . CERVICAL BIOPSY  W/ LOOP ELECTRODE EXCISION    . CESAREAN SECTION  1997  . COLPOSCOPY    . ROBOTIC ASSISTED LAPAROSCOPIC HYSTERECTOMY AND SALPINGECTOMY  08/06/11   fibroids partial hysterectomy    Current Outpatient Medications  Medication Sig Dispense Refill  . nitrofurantoin, macrocrystal-monohydrate, (MACROBID) 100 MG capsule Take one capsule after coitus, may repeat again 12 hours if needed. 30 capsule 3  . olopatadine (PATANOL) 0.1 %  ophthalmic solution Place 2 drops into both eyes at bedtime.     Earney Navy Bicarbonate (ZEGERID PO) Take by mouth.    . pantoprazole (PROTONIX) 40 MG tablet Take 1 tablet by mouth daily.    . Probiotic Product (PROBIOTIC PO) Take by mouth daily.    . Pseudoephedrine-Ibuprofen (ADVIL COLD/SINUS PO) Take by mouth as needed.    . SUMAtriptan (IMITREX) 100 MG tablet     . valACYclovir (VALTREX) 500 MG tablet Bid x 3 days at onset of outbreaks 30 tablet 12   No current facility-administered medications for this visit.      ALLERGIES: Morphine and related  Family History  Problem Relation Age of Onset  . Hypertension Father   . Cancer Maternal Grandmother        tumor  on the brain?    Social History   Socioeconomic History  . Marital status: Divorced    Spouse name: Not on file  . Number of children: 1  . Years of education: Not on file  . Highest education level: Not on file  Social Needs  . Financial resource strain: Not on file  . Food insecurity - worry: Not on file  . Food insecurity - inability: Not on file  . Transportation needs - medical: Not on file  .  Transportation needs - non-medical: Not on file  Occupational History    Employer: [employer]  Tobacco Use  . Smoking status: Never Smoker  . Smokeless tobacco: Never Used  Substance and Sexual Activity  . Alcohol use: Yes    Comment: 3-4 a night on friday, 3-4 a night saturday  . Drug use: No  . Sexual activity: Yes    Partners: Male    Birth control/protection: Surgical    Comment: hysterectomy  Other Topics Concern  . Not on file  Social History Narrative  . Not on file    Review of Systems  Constitutional: Negative.   HENT: Negative.   Eyes: Negative.   Respiratory: Negative.   Cardiovascular: Negative.   Gastrointestinal: Negative.   Genitourinary:       Vaginal itching and burning   Musculoskeletal: Negative.   Skin: Negative.   Neurological: Negative.    Endo/Heme/Allergies: Negative.   Psychiatric/Behavioral: Negative.     PHYSICAL EXAMINATION:    BP 118/80 (BP Location: Right Arm, Patient Position: Sitting, Cuff Size: Normal)   Pulse 84   Resp 14   Wt 147 lb (66.7 kg)   LMP 08/06/2011 (Exact Date)   BMI 22.52 kg/m     General appearance: alert, cooperative and appears stated age  Pelvic: External genitalia:  no lesions              Urethra:  normal appearing urethra with no masses, tenderness or lesions              Bartholins and Skenes: normal                 Vagina: She has a dark raised lesion at the vaginal apex towards her right, ? Cyst, tender to palpation              Cervix: absent              Bimanual Exam:  Uterus:  uterus absent              Adnexa: no mass, fullness, tenderness               Chaperone was present for exam.  Wet prep: ? Clue, + artifact from the monistat, no trich, + wbc KOH: + yeast PH: 4.5   ASSESSMENT Vulvovaginitis, definitely has yeast, not clear about BV Vaginal lesion, pap was negative from vaginal apex last week    PLAN Treat with diflucan and steroid ointment Send affirm Return for colposcopy of vaginal apex   An After Visit Summary was printed and given to the patient.

## 2017-05-21 NOTE — Patient Instructions (Signed)

## 2017-05-22 LAB — VAGINITIS/VAGINOSIS, DNA PROBE
Candida Species: NEGATIVE
Gardnerella vaginalis: NEGATIVE
TRICHOMONAS VAG: NEGATIVE

## 2017-06-17 ENCOUNTER — Ambulatory Visit (INDEPENDENT_AMBULATORY_CARE_PROVIDER_SITE_OTHER): Payer: BLUE CROSS/BLUE SHIELD | Admitting: Obstetrics and Gynecology

## 2017-06-17 ENCOUNTER — Ambulatory Visit: Payer: BLUE CROSS/BLUE SHIELD | Admitting: Obstetrics and Gynecology

## 2017-06-17 ENCOUNTER — Other Ambulatory Visit: Payer: Self-pay

## 2017-06-17 ENCOUNTER — Encounter: Payer: Self-pay | Admitting: Obstetrics and Gynecology

## 2017-06-17 VITALS — BP 102/70 | HR 80 | Resp 14 | Wt 148.0 lb

## 2017-06-17 DIAGNOSIS — N898 Other specified noninflammatory disorders of vagina: Secondary | ICD-10-CM | POA: Diagnosis not present

## 2017-06-17 NOTE — Progress Notes (Signed)
GYNECOLOGY  VISIT   HPI: 47 y.o.   Divorced  Caucasian  female   G1P1001 with Patient's last menstrual period was 08/06/2011 (exact date).   here for colposcopy. The patient was noted to have a vaginal lesion on exam. Pap from last month from the vaginal cuff was negative with negative hpv.      GYNECOLOGIC HISTORY: Patient's last menstrual period was 08/06/2011 (exact date). Contraception:hysterectomy  Menopausal hormone therapy: none         OB History    Gravida Para Term Preterm AB Living   1 1 1  0 0 1   SAB TAB Ectopic Multiple Live Births   0 0 0 0 1         Patient Active Problem List   Diagnosis Date Noted  . Urinary tract infectious disease 05/02/2015    Past Medical History:  Diagnosis Date  . Abnormal Pap smear of cervix   . Anemia   . Fibroid   . GERD (gastroesophageal reflux disease)   . Headache(784.0)   . 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 PONV (postoperative nausea and vomiting)   . STD (sexually transmitted disease) 05/2008   HSV I - by C & S and IgG (labial)    Past Surgical History:  Procedure Laterality Date  . CERVICAL BIOPSY  W/ LOOP ELECTRODE EXCISION    . CESAREAN SECTION  1997  . COLPOSCOPY    . ROBOTIC ASSISTED LAPAROSCOPIC HYSTERECTOMY AND SALPINGECTOMY  08/06/11   fibroids partial hysterectomy    Current Outpatient Medications  Medication Sig Dispense Refill  . betamethasone valerate ointment (VALISONE) 0.1 % Apply a pea sized BID for 1-2 weeks prn 15 g 0  . nitrofurantoin, macrocrystal-monohydrate, (MACROBID) 100 MG capsule Take one capsule after coitus, may repeat again 12 hours if needed. 30 capsule 3  . olopatadine (PATANOL) 0.1 % ophthalmic solution Place 2 drops into both eyes at bedtime.     Earney Navy Bicarbonate (ZEGERID PO) Take by mouth.    . pantoprazole (PROTONIX) 40 MG tablet Take 1 tablet by mouth daily.    . Probiotic Product (PROBIOTIC PO) Take by mouth daily.    . Pseudoephedrine-Ibuprofen (ADVIL  COLD/SINUS PO) Take by mouth as needed.    . SUMAtriptan (IMITREX) 100 MG tablet     . valACYclovir (VALTREX) 500 MG tablet Bid x 3 days at onset of outbreaks 30 tablet 12   No current facility-administered medications for this visit.      ALLERGIES: Morphine and related  Family History  Problem Relation Age of Onset  . Hypertension Father   . Cancer Maternal Grandmother        tumor  on the brain?    Social History   Socioeconomic History  . Marital status: Divorced    Spouse name: Not on file  . Number of children: 1  . Years of education: Not on file  . Highest education level: Not on file  Social Needs  . Financial resource strain: Not on file  . Food insecurity - worry: Not on file  . Food insecurity - inability: Not on file  . Transportation needs - medical: Not on file  . Transportation needs - non-medical: Not on file  Occupational History    Employer: [employer]  Tobacco Use  . Smoking status: Never Smoker  . Smokeless tobacco: Never Used  Substance and Sexual Activity  . Alcohol use: Yes    Comment: 3-4 a night  on friday, 3-4 a night saturday  . Drug use: No  . Sexual activity: Yes    Partners: Male    Birth control/protection: Surgical    Comment: hysterectomy  Other Topics Concern  . Not on file  Social History Narrative  . Not on file    Review of Systems  Constitutional: Negative.   HENT: Positive for sinus pain.   Eyes: Negative.   Respiratory: Negative.   Cardiovascular: Negative.   Gastrointestinal: Negative.   Genitourinary: Negative.   Musculoskeletal: Negative.   Skin: Negative.   Neurological: Negative.   Endo/Heme/Allergies: Negative.   Psychiatric/Behavioral: Positive for depression.    PHYSICAL EXAMINATION:    BP 102/70 (BP Location: Right Arm, Patient Position: Sitting, Cuff Size: Normal)   Pulse 80   Resp 14   Wt 148 lb (67.1 kg)   LMP 08/06/2011 (Exact Date)   BMI 22.67 kg/m     General appearance: alert,  cooperative and appears stated age  Pelvic: External genitalia:  no lesions              Urethra:  normal appearing urethra with no masses, tenderness or lesions              Bartholins and Skenes: normal                 Vagina: dark lesion on the right vaginal apex. Raised, tender, ? Cyst  Colposcopy performed, no aceto-white changes or lugols changes noted at the vaginal apex. The lesion was biopsied, dark fluid escaped. Slight bleeding, stopped with silver nitrate.   Chaperone was present for exam.  ASSESSMENT Vaginal lesion    PLAN Vaginal colposcopy and biopsy Further plan depending on the results   An After Visit Summary was printed and given to the patient.  CC: Evalee Mutton, CNM

## 2017-06-17 NOTE — Patient Instructions (Signed)

## 2018-02-17 ENCOUNTER — Other Ambulatory Visit: Payer: Self-pay | Admitting: Certified Nurse Midwife

## 2018-02-17 DIAGNOSIS — Z87440 Personal history of urinary (tract) infections: Secondary | ICD-10-CM

## 2018-02-18 NOTE — Telephone Encounter (Signed)
Medication refill request: macrobid Last AEX:  05/15/2017 Next AEX: 05/21/2018 Last MMG (if hormonal medication request): 06/26/2016 Refill authorized: #30, 3 refills.

## 2018-05-16 ENCOUNTER — Other Ambulatory Visit: Payer: Self-pay

## 2018-05-16 ENCOUNTER — Ambulatory Visit: Payer: BLUE CROSS/BLUE SHIELD | Admitting: Certified Nurse Midwife

## 2018-05-16 ENCOUNTER — Encounter: Payer: Self-pay | Admitting: Certified Nurse Midwife

## 2018-05-16 VITALS — BP 102/68 | HR 64 | Resp 16 | Ht 67.75 in | Wt 150.0 lb

## 2018-05-16 DIAGNOSIS — Z0000 Encounter for general adult medical examination without abnormal findings: Secondary | ICD-10-CM

## 2018-05-16 DIAGNOSIS — B009 Herpesviral infection, unspecified: Secondary | ICD-10-CM

## 2018-05-16 DIAGNOSIS — Z01419 Encounter for gynecological examination (general) (routine) without abnormal findings: Secondary | ICD-10-CM

## 2018-05-16 DIAGNOSIS — N898 Other specified noninflammatory disorders of vagina: Secondary | ICD-10-CM

## 2018-05-16 DIAGNOSIS — E559 Vitamin D deficiency, unspecified: Secondary | ICD-10-CM

## 2018-05-16 DIAGNOSIS — Z87440 Personal history of urinary (tract) infections: Secondary | ICD-10-CM | POA: Diagnosis not present

## 2018-05-16 DIAGNOSIS — Z1211 Encounter for screening for malignant neoplasm of colon: Secondary | ICD-10-CM

## 2018-05-16 NOTE — Progress Notes (Signed)
47 y.o. G22P1001 Divorced  Caucasian Fe here for annual exam. C/O of UTI symptoms and started Macrobid bid has  for post coital treatment and has taken 3 doses, but still urinary frequency. No fever or chills or back ache. Feeling better. Has had HSV x 2 in past and need up to date on Rx. History of yeast with any Macrobid use if UTI. Sees Urgent care for sinus issues only. Better year she see sold her house and has steady sincere partner. No STD concerns. Screening labs if needed. No other health issues today.  Patient's last menstrual period was 08/06/2011 (exact date).          Sexually active: Yes.    The current method of family planning is status post hysterectomy.    Exercising: Yes.    weights, run & cardio Smoker:  no  Review of Systems  Constitutional:       Weight gain  HENT: Negative.   Eyes: Negative.   Respiratory: Negative.   Cardiovascular: Negative.   Gastrointestinal: Negative.   Genitourinary: Negative.   Musculoskeletal: Negative.   Skin: Negative.   Neurological: Negative.   Endo/Heme/Allergies: Negative.   Psychiatric/Behavioral: Negative.     Health Maintenance: Pap:  02-14-11 neg, 05-15-17 neg HPV HR neg (1/19 had vaginal biopsy) History of Abnormal Pap: yes MMG:  1/19 pt to sign release Self Breast exams: yes Colonoscopy:  none BMD:   none TDaP:  2010 Shingles: no Pneumonia: no Hep C and HIV: HIV done 80yrs ago Labs: yes   reports that she has never smoked. She has never used smokeless tobacco. She reports current alcohol use. She reports that she does not use drugs.  Past Medical History:  Diagnosis Date  . Abnormal Pap smear of cervix   . Anemia   . Fibroid   . GERD (gastroesophageal reflux disease)   . Headache(784.0)   . 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 PONV (postoperative nausea and vomiting)   . STD (sexually transmitted disease) 05/2008   HSV I - by C & S and IgG (labial)    Past Surgical History:  Procedure Laterality Date   . CERVICAL BIOPSY  W/ LOOP ELECTRODE EXCISION    . CESAREAN SECTION  1997  . COLPOSCOPY    . ROBOTIC ASSISTED LAPAROSCOPIC HYSTERECTOMY AND SALPINGECTOMY  08/06/11   fibroids partial hysterectomy    Current Outpatient Medications  Medication Sig Dispense Refill  . betamethasone valerate ointment (VALISONE) 0.1 % Apply a pea sized BID for 1-2 weeks prn 15 g 0  . nitrofurantoin, macrocrystal-monohydrate, (MACROBID) 100 MG capsule TAKE ONE CAPSULE AFTER COITUS, MAY REPEAT AGAIN IN 12 HOURS IF NEEDED 30 capsule 2  . olopatadine (PATANOL) 0.1 % ophthalmic solution Place 2 drops into both eyes at bedtime.     Earney Navy Bicarbonate (ZEGERID PO) Take by mouth.    . pantoprazole (PROTONIX) 40 MG tablet Take 1 tablet by mouth daily.    . Probiotic Product (PROBIOTIC PO) Take by mouth daily.    . Pseudoephedrine-Ibuprofen (ADVIL COLD/SINUS PO) Take by mouth as needed.    . SUMAtriptan (IMITREX) 100 MG tablet     . valACYclovir (VALTREX) 500 MG tablet Bid x 3 days at onset of outbreaks 30 tablet 12   No current facility-administered medications for this visit.     Family History  Problem Relation Age of Onset  . Hypertension Father   . Cancer Maternal Grandmother  tumor  on the brain?    ROS:  Pertinent items are noted in HPI.  Otherwise, a comprehensive ROS was negative.  Exam:   LMP 08/06/2011 (Exact Date)    Ht Readings from Last 3 Encounters:  02/26/17 5' 7.75" (1.721 m)  07/16/16 5' 7.75" (1.721 m)  05/10/16 5' 7.75" (1.721 m)    General appearance: alert, cooperative and appears stated age Head: Normocephalic, without obvious abnormality, atraumatic Neck: no adenopathy, supple, symmetrical, trachea midline and thyroid normal to inspection and palpation Lungs: clear to auscultation bilaterally CVAT: negative bilateral Breasts: normal appearance, no masses or tenderness, No nipple retraction or dimpling, No nipple discharge or bleeding, No axillary or  supraclavicular adenopathy Heart: regular rate and rhythm Abdomen: soft, non-tender; no masses,  no organomegaly, + suprapubic Extremities: extremities normal, atraumatic, no cyanosis or edema Skin: Skin color, texture, turgor normal. No rashes or lesions Lymph nodes: Cervical, supraclavicular, and axillary nodes normal. No abnormal inguinal nodes palpated Neurologic: Grossly normal   Pelvic: External genitalia:  no lesions, no exudate              Urethra:  normal appearing urethra with no masses, slight  tenderness or lesions  Bladder slightly tender, urethral meatus non tender              Bartholin's and Skene's: normal                 Vagina: normal appearing vagina with normal color and discharge, no lesions              Cervix: absent              Pap taken: No. Bimanual Exam:  Uterus:  uterus absent              Adnexa: normal adnexa and no mass, fullness, tenderness               Rectovaginal: Confirms               Anus:  normal sphincter tone, no lesions  Chaperone present: yes  A:  Well Woman with normal exam  S/P TAH with bilateral salpingectomy for fibroids  UTI history of post coital taking Macrobid, needs complete Rx.  History of HSV with outbreaks x 2 needs Rx update  Colonoscopy candidate  Screening labs  P:   Reviewed health and wellness pertinent to exam  Warning signs of UTI given  Rx Macrobid see order with instructions  Lab: Urine micro   Rx Valtrex see order with instructions  Discussed risks/benefits request referral for 2020  Labs: CMP,Lipid panel, Vitamin D  Pap smear: no   counseled on breast self exam, mammography screening, STD prevention, HIV risk factors and prevention, adequate intake of calcium and vitamin D, diet and exercise  return annually or prn  An After Visit Summary was printed and given to the patient.

## 2018-05-17 LAB — COMPREHENSIVE METABOLIC PANEL
A/G RATIO: 1.9 (ref 1.2–2.2)
ALK PHOS: 62 IU/L (ref 39–117)
ALT: 13 IU/L (ref 0–32)
AST: 18 IU/L (ref 0–40)
Albumin: 4.3 g/dL (ref 3.5–5.5)
BUN/Creatinine Ratio: 12 (ref 9–23)
BUN: 9 mg/dL (ref 6–24)
Bilirubin Total: 1.1 mg/dL (ref 0.0–1.2)
CO2: 24 mmol/L (ref 20–29)
Calcium: 9 mg/dL (ref 8.7–10.2)
Chloride: 99 mmol/L (ref 96–106)
Creatinine, Ser: 0.77 mg/dL (ref 0.57–1.00)
GFR calc Af Amer: 105 mL/min/{1.73_m2} (ref >59)
GFR calc non Af Amer: 91 mL/min/{1.73_m2} (ref >59)
GLOBULIN, TOTAL: 2.3 g/dL (ref 1.5–4.5)
Glucose: 71 mg/dL (ref 65–99)
POTASSIUM: 4.4 mmol/L (ref 3.5–5.2)
SODIUM: 136 mmol/L (ref 134–144)
Total Protein: 6.6 g/dL (ref 6.0–8.5)

## 2018-05-17 LAB — LIPID PANEL
CHOLESTEROL TOTAL: 183 mg/dL (ref 100–199)
Chol/HDL Ratio: 2.2 ratio (ref 0.0–4.4)
HDL: 82 mg/dL (ref >39)
LDL Calculated: 87 mg/dL (ref 0–99)
TRIGLYCERIDES: 69 mg/dL (ref 0–149)
VLDL Cholesterol Cal: 14 mg/dL (ref 5–40)

## 2018-05-17 LAB — VAGINITIS/VAGINOSIS, DNA PROBE
Candida Species: NEGATIVE
GARDNERELLA VAGINALIS: NEGATIVE
Trichomonas vaginosis: NEGATIVE

## 2018-05-17 LAB — VITAMIN D 25 HYDROXY (VIT D DEFICIENCY, FRACTURES): Vit D, 25-Hydroxy: 31.3 ng/mL (ref 30.0–100.0)

## 2018-05-18 LAB — URINE CULTURE: Organism ID, Bacteria: NO GROWTH

## 2018-05-21 ENCOUNTER — Ambulatory Visit: Payer: BLUE CROSS/BLUE SHIELD | Admitting: Certified Nurse Midwife

## 2018-06-05 ENCOUNTER — Other Ambulatory Visit: Payer: Self-pay | Admitting: Certified Nurse Midwife

## 2018-06-05 DIAGNOSIS — B009 Herpesviral infection, unspecified: Secondary | ICD-10-CM

## 2018-06-05 NOTE — Telephone Encounter (Signed)
Medication refill request: Valtrex Last AEX:  05-16-18 DL  Next AEX: 05-21-19  Last MMG (if hormonal medication request): ?1/19 per aex note on 05-16-18 Refill authorized: 05-15-17 #30, 12 RF. Today, please advise.    Medication pended #30, 11 RF. Please advise.

## 2018-07-03 ENCOUNTER — Encounter: Payer: Self-pay | Admitting: Certified Nurse Midwife

## 2018-10-16 ENCOUNTER — Other Ambulatory Visit: Payer: Self-pay | Admitting: Certified Nurse Midwife

## 2018-10-16 DIAGNOSIS — Z87440 Personal history of urinary (tract) infections: Secondary | ICD-10-CM

## 2018-10-16 NOTE — Telephone Encounter (Signed)
Medication refill request: macrobid  Last AEX:  05/16/18 DL Next AEX: 05/21/19  Last MMG (if hormonal medication request): 07/03/18 BIRADS2:Benign  Refill authorized: 02/18/18 #30tabs/2R. Today please advise.

## 2019-05-15 NOTE — Progress Notes (Signed)
48 y.o. G43P1001 Divorced  Caucasian Fe here for annual exam. Denies any vaginal symptoms or vaginal dryness. Using Macrobid for post coital use and working well, needs Rx update. Has had 2 HSV outbreaks this year and also takes when early symptoms. Will need update Rx. No migraine headaches this year!. Sees Urgent care if needed. Was treated for sinus infection recently. No other health issues. Works out in gym during the week.   Patient's last menstrual period was 08/06/2011 (exact date).          Sexually active: Yes.    The current method of family planning is status post hysterectomy.    Exercising: Yes.    weights, running, eliptical Smoker:  no  Review of Systems  Constitutional: Negative.   HENT: Negative.   Eyes: Negative.   Respiratory: Negative.   Cardiovascular: Negative.   Gastrointestinal: Negative.   Genitourinary: Negative.   Musculoskeletal: Negative.   Skin: Negative.   Neurological: Negative.   Endo/Heme/Allergies: Negative.   Psychiatric/Behavioral: Negative.     Health Maintenance: Pap:  05-15-17 neg HPV HR neg (1/19 vaginal biopsy) History of Abnormal Pap: yes MMG:  07-03-2018 category b density birads 2:neg Self Breast exams: no Colonoscopy: none BMD:  none TDaP: 2010 Shingles: no Pneumonia: no Hep C and HIV: HIV neg 38yrs ago Labs: yes screening    reports that she has never smoked. She has never used smokeless tobacco. She reports current alcohol use of about 6.0 - 7.0 standard drinks of alcohol per week. She reports that she does not use drugs.  Past Medical History:  Diagnosis Date  . Abnormal Pap smear of cervix   . Anemia   . Fibroid   . GERD (gastroesophageal reflux disease)   . Headache(784.0)   . 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 PONV (postoperative nausea and vomiting)   . STD (sexually transmitted disease) 05/2008   HSV I - by C & S and IgG (labial)    Past Surgical History:  Procedure Laterality Date  . CERVICAL BIOPSY  W/  LOOP ELECTRODE EXCISION    . CESAREAN SECTION  1997  . COLPOSCOPY    . ROBOTIC ASSISTED LAPAROSCOPIC HYSTERECTOMY AND SALPINGECTOMY  08/06/11   fibroids partial hysterectomy    Current Outpatient Medications  Medication Sig Dispense Refill  . nitrofurantoin, macrocrystal-monohydrate, (MACROBID) 100 MG capsule TAKE ONE CAPSULE AFTER COITUS, MAY REPEAT AGAIN 12 HOURS IF NEEDED. 30 capsule 1  . olopatadine (PATANOL) 0.1 % ophthalmic solution Place 2 drops into both eyes at bedtime.     Earney Navy Bicarbonate (ZEGERID PO) Take by mouth.    Marland Kitchen oxymetazoline (AFRIN) 0.05 % nasal spray Place 1 spray into both nostrils 2 (two) times daily.    . Probiotic Product (PROBIOTIC PO) Take by mouth as needed.     . valACYclovir (VALTREX) 500 MG tablet TAKE 1 TABLET BY MOUTH TWICE A DAY X 3 DAYS AT ONSET OF OUTBREAKS 30 tablet 11  . SUMAtriptan (IMITREX) 100 MG tablet      No current facility-administered medications for this visit.    Family History  Problem Relation Age of Onset  . Hypertension Father   . Cancer Maternal Grandmother        tumor  on the brain?    ROS:  Pertinent items are noted in HPI.  Otherwise, a comprehensive ROS was negative.  Exam:   BP 114/68   Pulse 70   Temp (!) 97.1 F (36.2 C) (Skin)  Resp 16   Ht 5' 7.75" (1.721 m)   Wt 150 lb (68 kg)   LMP 08/06/2011 (Exact Date)   BMI 22.98 kg/m  Height: 5' 7.75" (172.1 cm) Ht Readings from Last 3 Encounters:  05/18/19 5' 7.75" (1.721 m)  05/16/18 5' 7.75" (1.721 m)  02/26/17 5' 7.75" (1.721 m)    General appearance: alert, cooperative and appears stated age Head: Normocephalic, without obvious abnormality, atraumatic Neck: no adenopathy, supple, symmetrical, trachea midline and thyroid normal to inspection and palpation Lungs: clear to auscultation bilaterally Breasts: normal appearance, no masses or tenderness, No nipple retraction or dimpling, No nipple discharge or bleeding, No axillary or supraclavicular  adenopathy Heart: regular rate and rhythm Abdomen: soft, non-tender; no masses,  no organomegaly Extremities: extremities normal, atraumatic, no cyanosis or edema Skin: Skin color, texture, turgor normal. No rashes or lesions Lymph nodes: Cervical, supraclavicular, and axillary nodes normal. No abnormal inguinal nodes palpated Neurologic: Grossly normal   Pelvic: External genitalia:  no lesions              Urethra:  normal appearing urethra with no masses, tenderness or lesions              Bartholin's and Skene's: normal                 Vagina: normal appearing vagina with normal color and discharge, no lesions              Cervix: absent              Pap taken: No. Bimanual Exam:  Uterus:  uterus absent              Adnexa: normal adnexa and no mass, fullness, tenderness               Rectovaginal: Confirms               Anus:  normal sphincter tone, no lesions  Chaperone present: yes  A:  Well Woman with normal exam  Menopausal s/p hysterectomy with bilateral salpingectomy  History of post coital Macrobid working well  History of HSV  Needs Rx update  Colonoscopy due  Immunization update  Screening labs  P:   Reviewed health and wellness pertinent to exam  Discussed importance of advising if vaginal dryness which can also contribute to UTI  Rx Macrobid see order with instructions  Rx Valtrex see order with instructions  Discussed risks/benefits of colonoscopy, request referral. Will dispense IFOB today. She will be called with information  Requests TDAP  Labs: Lipid panel, CMP, CBC, TSH, Vitamin D  Pap smear: no   counseled on breast self exam, mammography screening, feminine hygiene, menopause, adequate intake of calcium and vitamin D, diet and exercise  return annually or prn  An After Visit Summary was printed and given to the patient.

## 2019-05-18 ENCOUNTER — Ambulatory Visit (INDEPENDENT_AMBULATORY_CARE_PROVIDER_SITE_OTHER): Payer: BC Managed Care – PPO | Admitting: Certified Nurse Midwife

## 2019-05-18 ENCOUNTER — Other Ambulatory Visit: Payer: Self-pay

## 2019-05-18 ENCOUNTER — Encounter: Payer: Self-pay | Admitting: Certified Nurse Midwife

## 2019-05-18 VITALS — BP 114/68 | HR 70 | Temp 97.1°F | Resp 16 | Ht 67.75 in | Wt 150.0 lb

## 2019-05-18 DIAGNOSIS — Z1211 Encounter for screening for malignant neoplasm of colon: Secondary | ICD-10-CM

## 2019-05-18 DIAGNOSIS — Z0000 Encounter for general adult medical examination without abnormal findings: Secondary | ICD-10-CM

## 2019-05-18 DIAGNOSIS — E559 Vitamin D deficiency, unspecified: Secondary | ICD-10-CM

## 2019-05-18 DIAGNOSIS — Z87440 Personal history of urinary (tract) infections: Secondary | ICD-10-CM

## 2019-05-18 DIAGNOSIS — N390 Urinary tract infection, site not specified: Secondary | ICD-10-CM

## 2019-05-18 DIAGNOSIS — Z23 Encounter for immunization: Secondary | ICD-10-CM | POA: Diagnosis not present

## 2019-05-18 DIAGNOSIS — Z01419 Encounter for gynecological examination (general) (routine) without abnormal findings: Secondary | ICD-10-CM

## 2019-05-18 DIAGNOSIS — B009 Herpesviral infection, unspecified: Secondary | ICD-10-CM

## 2019-05-18 LAB — CBC
MCV: 94 fL (ref 79–97)
WBC: 11.6 10*3/uL — ABNORMAL HIGH (ref 3.4–10.8)

## 2019-05-18 LAB — COMPREHENSIVE METABOLIC PANEL

## 2019-05-18 LAB — LIPID PANEL

## 2019-05-18 MED ORDER — NITROFURANTOIN MONOHYD MACRO 100 MG PO CAPS: ORAL_CAPSULE | ORAL | 3 refills | Status: DC

## 2019-05-18 MED ORDER — VALACYCLOVIR HCL 500 MG PO TABS: ORAL_TABLET | ORAL | 11 refills | Status: DC

## 2019-05-19 ENCOUNTER — Other Ambulatory Visit: Payer: Self-pay | Admitting: Certified Nurse Midwife

## 2019-05-19 DIAGNOSIS — R899 Unspecified abnormal finding in specimens from other organs, systems and tissues: Secondary | ICD-10-CM

## 2019-05-19 LAB — CBC
Hematocrit: 36.1 % (ref 34.0–46.6)
Hemoglobin: 12.1 g/dL (ref 11.1–15.9)
MCH: 31.6 pg (ref 26.6–33.0)
MCHC: 33.5 g/dL (ref 31.5–35.7)
Platelets: 304 10*3/uL (ref 150–450)
RBC: 3.83 x10E6/uL (ref 3.77–5.28)
RDW: 12 % (ref 11.7–15.4)

## 2019-05-19 LAB — LIPID PANEL
Chol/HDL Ratio: 2 ratio (ref 0.0–4.4)
Cholesterol, Total: 214 mg/dL — ABNORMAL HIGH (ref 100–199)
HDL: 109 mg/dL (ref >39)
LDL Chol Calc (NIH): 89 mg/dL (ref 0–99)
VLDL Cholesterol Cal: 16 mg/dL (ref 5–40)

## 2019-05-19 LAB — COMPREHENSIVE METABOLIC PANEL
AST: 15 IU/L (ref 0–40)
Albumin/Globulin Ratio: 1.5 (ref 1.2–2.2)
Albumin: 3.9 g/dL (ref 3.8–4.8)
Alkaline Phosphatase: 62 IU/L (ref 39–117)
BUN/Creatinine Ratio: 20 (ref 9–23)
Bilirubin Total: 0.6 mg/dL (ref 0.0–1.2)
CO2: 26 mmol/L (ref 20–29)
Calcium: 9 mg/dL (ref 8.7–10.2)
Chloride: 99 mmol/L (ref 96–106)
Creatinine, Ser: 0.79 mg/dL (ref 0.57–1.00)
GFR calc Af Amer: 101 mL/min/{1.73_m2} (ref >59)
GFR calc non Af Amer: 88 mL/min/{1.73_m2} (ref >59)
Globulin, Total: 2.6 g/dL (ref 1.5–4.5)
Glucose: 82 mg/dL (ref 65–99)
Potassium: 3.6 mmol/L (ref 3.5–5.2)
Sodium: 137 mmol/L (ref 134–144)
Total Protein: 6.5 g/dL (ref 6.0–8.5)

## 2019-05-19 LAB — VITAMIN D 25 HYDROXY (VIT D DEFICIENCY, FRACTURES): Vit D, 25-Hydroxy: 31 ng/mL (ref 30.0–100.0)

## 2019-05-19 LAB — TSH: TSH: 2.32 u[IU]/mL (ref 0.450–4.500)

## 2019-05-21 ENCOUNTER — Ambulatory Visit: Payer: BLUE CROSS/BLUE SHIELD | Admitting: Certified Nurse Midwife

## 2019-07-06 ENCOUNTER — Encounter: Payer: Self-pay | Admitting: Certified Nurse Midwife

## 2019-07-09 ENCOUNTER — Encounter: Payer: Self-pay | Admitting: Certified Nurse Midwife

## 2019-08-24 ENCOUNTER — Encounter: Payer: Self-pay | Admitting: Certified Nurse Midwife

## 2020-05-18 ENCOUNTER — Ambulatory Visit: Payer: BC Managed Care – PPO | Admitting: Certified Nurse Midwife

## 2020-05-18 ENCOUNTER — Other Ambulatory Visit: Payer: Self-pay | Admitting: *Deleted

## 2020-05-18 DIAGNOSIS — Z87440 Personal history of urinary (tract) infections: Secondary | ICD-10-CM

## 2020-05-18 DIAGNOSIS — N390 Urinary tract infection, site not specified: Secondary | ICD-10-CM

## 2020-05-18 MED ORDER — NITROFURANTOIN MONOHYD MACRO 100 MG PO CAPS: ORAL_CAPSULE | ORAL | 0 refills | Status: DC

## 2020-05-18 NOTE — Telephone Encounter (Signed)
Prescription for Macrobid 100mg  capsules faxed to [facility] on [address] [facility]. Fax #: [phone].

## 2020-05-18 NOTE — Telephone Encounter (Signed)
Medication refill request: Nitrofurantoin  Last AEX:  05-18-2019 DL  Next AEX: attempted to reach patient to schedule, VM full unable to leave a message Last MMG (if hormonal medication request): n/a Refill authorized: today, please advise.   Medication pended for #30, 0RF. Please refill if appropriate.

## 2020-06-23 ENCOUNTER — Encounter: Payer: Self-pay | Admitting: Nurse Practitioner

## 2020-06-23 ENCOUNTER — Ambulatory Visit (INDEPENDENT_AMBULATORY_CARE_PROVIDER_SITE_OTHER): Payer: BC Managed Care – PPO | Admitting: Nurse Practitioner

## 2020-06-23 ENCOUNTER — Other Ambulatory Visit: Payer: Self-pay

## 2020-06-23 VITALS — BP 114/70 | HR 68 | Resp 16 | Ht 68.25 in | Wt 149.0 lb

## 2020-06-23 DIAGNOSIS — Z01419 Encounter for gynecological examination (general) (routine) without abnormal findings: Secondary | ICD-10-CM | POA: Diagnosis not present

## 2020-06-23 DIAGNOSIS — R232 Flushing: Secondary | ICD-10-CM

## 2020-06-23 MED ORDER — ESTRADIOL 0.05 MG/24HR TD PTTW: 1.0000 | MEDICATED_PATCH | TRANSDERMAL | 12 refills | Status: DC

## 2020-06-23 NOTE — Patient Instructions (Addendum)
Health Maintenance, Female Adopting a healthy lifestyle and getting preventive care are important in promoting health and wellness. Ask your health care provider about:  The right schedule for you to have regular tests and exams.  Things you can do on your own to prevent diseases and keep yourself healthy. What should I know about diet, weight, and exercise? Eat a healthy diet  Eat a diet that includes plenty of vegetables, fruits, low-fat dairy products, and lean protein.  Do not eat a lot of foods that are high in solid fats, added sugars, or sodium.   Maintain a healthy weight Body mass index (BMI) is used to identify weight problems. It estimates body fat based on height and weight. Your health care provider can help determine your BMI and help you achieve or maintain a healthy weight. Get regular exercise Get regular exercise. This is one of the most important things you can do for your health. Most adults should:  Exercise for at least 150 minutes each week. The exercise should increase your heart rate and make you sweat (moderate-intensity exercise).  Do strengthening exercises at least twice a week. This is in addition to the moderate-intensity exercise.  Spend less time sitting. Even light physical activity can be beneficial. Watch cholesterol and blood lipids Have your blood tested for lipids and cholesterol at 52 years of age, then have this test every 5 years. Have your cholesterol levels checked more often if:  Your lipid or cholesterol levels are high.  You are older than 52 years of age.  You are at high risk for heart disease. What should I know about cancer screening? Depending on your health history and family history, you may need to have cancer screening at various ages. This may include screening for:  Breast cancer.  Cervical cancer.  Colorectal cancer.  Skin cancer.  Lung cancer. What should I know about heart disease, diabetes, and high blood  pressure? Blood pressure and heart disease  High blood pressure causes heart disease and increases the risk of stroke. This is more likely to develop in people who have high blood pressure readings, are of African descent, or are overweight.  Have your blood pressure checked: ? Every 3-5 years if you are 18-39 years of age. ? Every year if you are 40 years old or older. Diabetes Have regular diabetes screenings. This checks your fasting blood sugar level. Have the screening done:  Once every three years after age 40 if you are at a normal weight and have a low risk for diabetes.  More often and at a younger age if you are overweight or have a high risk for diabetes. What should I know about preventing infection? Hepatitis B If you have a higher risk for hepatitis B, you should be screened for this virus. Talk with your health care provider to find out if you are at risk for hepatitis B infection. Hepatitis C Testing is recommended for:  Everyone born from 1945 through 1965.  Anyone with known risk factors for hepatitis C. Sexually transmitted infections (STIs)  Get screened for STIs, including gonorrhea and chlamydia, if: ? You are sexually active and are younger than 52 years of age. ? You are older than 52 years of age and your health care provider tells you that you are at risk for this type of infection. ? Your sexual activity has changed since you were last screened, and you are at increased risk for chlamydia or gonorrhea. Ask your health care provider   if you are at risk.  Ask your health care provider about whether you are at high risk for HIV. Your health care provider may recommend a prescription medicine to help prevent HIV infection. If you choose to take medicine to prevent HIV, you should first get tested for HIV. You should then be tested every 3 months for as long as you are taking the medicine. Pregnancy  If you are about to stop having your period (premenopausal) and  you may become pregnant, seek counseling before you get pregnant.  Take 400 to 800 micrograms (mcg) of folic acid every day if you become pregnant.  Ask for birth control (contraception) if you want to prevent pregnancy. Osteoporosis and menopause Osteoporosis is a disease in which the bones lose minerals and strength with aging. This can result in bone fractures. If you are 49 years old or older, or if you are at risk for osteoporosis and fractures, ask your health care provider if you should:  Be screened for bone loss.  Take a calcium or vitamin D supplement to lower your risk of fractures.  Be given hormone replacement therapy (HRT) to treat symptoms of menopause. Follow these instructions at home: Lifestyle  Do not use any products that contain nicotine or tobacco, such as cigarettes, e-cigarettes, and chewing tobacco. If you need help quitting, ask your health care provider.  Do not use street drugs.  Do not share needles.  Ask your health care provider for help if you need support or information about quitting drugs. Alcohol use  Do not drink alcohol if: ? Your health care provider tells you not to drink. ? You are pregnant, may be pregnant, or are planning to become pregnant.  If you drink alcohol: ? Limit how much you use to 0-1 drink a day. ? Limit intake if you are breastfeeding.  Be aware of how much alcohol is in your drink. In the [place] one drink equals one 12 oz bottle of beer (355 mL), one 5 oz glass of wine (148 mL), or one 1 oz glass of hard liquor (44 mL). General instructions  Schedule regular health, dental, and eye exams.  Stay current with your vaccines.  Tell your health care provider if: ? You often feel depressed. ? You have ever been abused or do not feel safe at home. Summary  Adopting a healthy lifestyle and getting preventive care are important in promoting health and wellness.  Follow your health care provider's instructions about healthy  diet, exercising, and getting tested or screened for diseases.  Follow your health care provider's instructions on monitoring your cholesterol and blood pressure. This information is not intended to replace advice given to you by your health care provider. Make sure you discuss any questions you have with your health care provider. Document Revised: 05/14/2018 Document Reviewed: 05/14/2018 Elsevier Patient Education  2021 [employer].  Estradiol Transdermal Patch What is this medicine? ESTRADIOL (es tra DYE ole) skin patches contain an estrogen. It is mostly used as hormone replacement during or after menopause. It helps to treat hot flashes and prevent osteoporosis. It is also used to treat women with low estrogen levels or those who have had their ovaries removed. This medicine may be used for other purposes; ask your health care provider or pharmacist if you have questions. COMMON BRAND NAME(S): Alora, Climara, DOTTI, Esclim, Estraderm, FemPatch, LYLLANA, Menostar, Minivelle, Vivelle, Vivelle-Dot What should I tell my health care provider before I take this medicine? They need to know if  you have any of these conditions:  abnormal vaginal bleeding  blood vessel disease or blood clots  breast, cervical, endometrial, ovarian, liver, or uterine cancer  dementia  diabetes (high blood sugar)  gallbladder disease  heart disease or recent heart attack  high blood pressure  high cholesterol  high levels of calcium in the blood  hysterectomy  kidney disease  liver disease  low thyroid levels  lupus  migraine headaches  protein C deficiency  protein S deficiency  smoke tobacco cigarettes  stroke  an unusual or allergic reaction to estrogens, other medicines, foods, dyes, or preservatives  pregnant or trying to get pregnant  breast-feeding How should I use this medicine? This medicine is for external use only. Use it as directed on the prescription label. Apply  the patch, sticky side to the skin, to an area that is clean, dry and hairless. Do not cut or trim the patch. Do not wear more than 1 patch at a time. Remove the old patch before using a new patch. Use a different site each time to prevent skin irritation. Keep using it unless your health care provider tells you to stop. This medicine comes with INSTRUCTIONS FOR USE. Ask your pharmacist for directions on how to use this medicine. Read the information carefully. Talk to your pharmacist or health care provider if you have questions. A patient package insert for the product will be given with each prescription and refill. Be sure to read this information carefully each time. Talk to your health care provider about the use of this medicine in children. Special care may be needed. Overdosage: If you think you have taken too much of this medicine contact a poison control center or emergency room at once. NOTE: This medicine is only for you. Do not share this medicine with others. What if I miss a dose? If you miss a dose, apply it as soon as you can. If it is almost time for your next dose, apply only that dose. Do not apply double or extra doses. What may interact with this medicine? Do not take this medicine with any of the following medications:  aromatase inhibitors like aminoglutethimide, anastrozole, exemestane, letrozole, testolactone This medicine may also interact with the following medications:  carbamazepine  certain antibiotics like erythromycin or clarithromycin  certain antiviral medicines for HIV or hepatitis  certain medicines for fungal infections like ketoconazole, itraconazole, or posaconazole  medicines for fungus infections like itraconazole and ketoconazole  phenobarbital  raloxifene  rifampin  St. John's Wort  tamoxifen This list may not describe all possible interactions. Give your health care provider a list of all the medicines, herbs, non-prescription drugs, or  dietary supplements you use. Also tell them if you smoke, drink alcohol, or use illegal drugs. Some items may interact with your medicine. What should I watch for while using this medicine? Visit your doctor or health care provider for regular checks on your progress. You will need a regular breast and pelvic exam and Pap smear while on this medicine. You should also discuss the need for regular mammograms with your health care provider, and follow his or her guidelines for these tests. This medicine can make your body retain fluid, making your fingers, hands, or ankles swell. Your blood pressure can go up. Contact your doctor or health care provider if you feel you are retaining fluid. If you have any reason to think you are pregnant, stop taking this medicine right away and contact your doctor or health care  provider. Smoking increases the risk of getting a blood clot or having a stroke while you are taking this medicine, especially if you are more than 52 years old. You are strongly advised not to smoke. If you wear contact lenses and notice visual changes, or if the lenses begin to feel uncomfortable, consult your eye doctor or health care provider. This medicine can increase the risk of developing a condition (endometrial hyperplasia) that may lead to cancer of the lining of the uterus. Taking progestins, another hormone drug, with this medicine lowers the risk of developing this condition. Therefore, if your uterus has not been removed (by a hysterectomy), your doctor may prescribe a progestin for you to take together with your estrogen. You should know, however, that taking estrogens with progestins may have additional health risks. You should discuss the use of estrogens and progestins with your health care provider to determine the benefits and risks for you. If you are going to need surgery, an MRI, CT scan, or other procedure, tell your health care provider that you are using this medicine. You  may need to remove the patch before the procedure. Contact with water while you are swimming, using a sauna, bathing, or showering may cause the patch to fall off. If your patch falls off reapply it. If you cannot reapply the patch, apply a new patch to another area and continue to follow your usual dose schedule. What side effects may I notice from receiving this medicine? Side effects that you should report to your doctor or health care provider as soon as possible:  allergic reactions (skin rash, itching or hives; swelling of the face, lips, or tongue)  blood clot (chest pain; shortness of breath; pain, swelling, or warmth in the leg)  edema (sudden weight gain; swelling of the ankles, feet, hands or other unusual swelling; trouble breathing)  light-colored stool  liver injury (dark yellow or brown urine; general ill feeling or flu-like symptoms; loss of appetite, right upper belly pain; unusually weak or tired, yellowing of the eyes or skin)  stroke (changes in vision; confusion; trouble speaking or understanding; severe headaches; sudden numbness or weakness of the face, arm or leg; trouble walking; dizziness; loss of balance or coordination)  unusual vaginal bleeding  unusual vaginal discharge, itching, or odor Side effects that usually do not require medical attention (report to your doctor or health care provider if they continue or are bothersome):  breast pain or tenderness  hair loss  headache  mild skin irritation, redness, or dryness at patch site  mild stomach upset or pain This list may not describe all possible side effects. Call your doctor for medical advice about side effects. You may report side effects to FDA at 1-800-FDA-1088. Where should I keep my medicine? Keep out of the reach of children and pets. Store at room temperature between 20 and 25 degrees C (68 and 77 degrees F). Keep this medicine in the original pouch until you are ready to use it. Get rid of  any unused medicine after the expiration date. Get rid of used patches properly. Since used patches may still contain active medicine, fold the patch in half so that it sticks to itself before throwing it away. Put it in the trash where children and pets cannot reach it. It is important to get rid of the medicine as soon as you no longer need it or it is expired. You can do this in two ways:  Take the medicine to a medicine  take-back program. Check with your pharmacy or law enforcement to find a location.  If you cannot return the medicine, ask your pharmacist or health care provider how to get rid of this medicine safely. NOTE: This sheet is a summary. It may not cover all possible information. If you have questions about this medicine, talk to your doctor, pharmacist, or health care provider.  2021 Elsevier/Gold Standard (2020-03-16 15:10:06)    [employer]in[employer] (14th ed., pp. [phone]). [place], [place]: Elsevier.">   Perimenopause Perimenopause is the normal time of a woman's life when the levels of estrogen, the female hormone produced by the ovaries, begin to decrease. This leads to changes in menstrual periods before they stop completely (menopause). Perimenopause can begin 2-8 years before menopause. During perimenopause, the ovaries may or may not produce an egg and a woman can still become pregnant. What are the causes? This condition is caused by a natural change in hormone levels that happens as you get older. What increases the risk? This condition is more likely to start at an earlier age if you have certain medical conditions or have undergone treatments, including:  A tumor of the pituitary gland in the brain.  A disease that affects the ovaries and hormone production.  Certain cancer treatments, such as chemotherapy or hormone therapy, or radiation therapy on the pelvis.  Heavy smoking and excessive alcohol use.  Family history of early menopause. What  are the signs or symptoms? Perimenopausal changes affect each woman differently. Symptoms of this condition may include:  Hot flashes.  Irregular menstrual periods.  Night sweats.  Changes in feelings about sex. This could be a decrease in sex drive or an increased discomfort around your sexuality.  Vaginal dryness.  Headaches.  Mood swings.  Depression.  Problems sleeping (insomnia).  Memory problems or trouble concentrating.  Irritability.  Tiredness.  Weight gain.  Anxiety.  Trouble getting pregnant. How is this diagnosed? This condition is diagnosed based on your medical history, a physical exam, your age, your menstrual history, and your symptoms. Hormone tests may also be done. How is this treated? In some cases, no treatment is needed. You and your health care provider should make a decision together about whether treatment is necessary. Treatment will be based on your individual condition and preferences. Various treatments are available, such as:  Menopausal hormone therapy (MHT).  Medicines to treat specific symptoms.  Acupuncture.  Vitamin or herbal supplements. Before starting treatment, make sure to let your health care provider know if you have a personal or family history of:  Heart disease.  Breast cancer.  Blood clots.  Diabetes.  Osteoporosis. Follow these instructions at home: Medicines  Take over-the-counter and prescription medicines only as told by your health care provider.  Take vitamin supplements only as told by your health care provider.  Talk with your health care provider before starting any herbal supplements. Lifestyle  Do not use any products that contain nicotine or tobacco, such as cigarettes, e-cigarettes, and chewing tobacco. If you need help quitting, ask your health care provider.  Get at least 30 minutes of physical activity on 5 or more days each week.  Eat a balanced diet that includes fresh fruits and  vegetables, whole grains, soybeans, eggs, lean meat, and low-fat dairy.  Avoid alcoholic and caffeinated beverages, as well as spicy foods. This may help prevent hot flashes.  Get 7-8 hours of sleep each night.  Dress in layers that can be removed to help you manage hot  flashes.  Find ways to manage stress, such as deep breathing, meditation, or journaling.   General instructions  Keep track of your menstrual periods, including: ? When they occur. ? How heavy they are and how long they last. ? How much time passes between periods.  Keep track of your symptoms, noting when they start, how often you have them, and how long they last.  Use vaginal lubricants or moisturizers to help with vaginal dryness and improve comfort during sex.  You can still become pregnant if you are having irregular periods. Make sure you use contraception during perimenopause if you do not want to get pregnant.  Keep all follow-up visits. This is important. This includes any group therapy or counseling.   Contact a health care provider if:  You have heavy vaginal bleeding or pass blood clots.  Your period lasts more than 2 days longer than normal.  Your periods are recurring sooner than 21 days.  You bleed after having sex.  You have pain during sex. Get help right away if you have:  Chest pain, trouble breathing, or trouble talking.  Severe depression.  Pain when you urinate.  Severe headaches.  Vision problems. Summary  Perimenopause is the time when a woman's body begins to move into menopause. This may happen naturally or as a result of other health problems or medical treatments.  Perimenopause can begin 2-8 years before menopause, and it can last for several years.  Perimenopausal symptoms can be managed through medicines, lifestyle changes, and complementary therapies such as acupuncture. This information is not intended to replace advice given to you by your health care provider.  Make sure you discuss any questions you have with your health care provider. Document Revised: 11/05/2019 Document Reviewed: 11/05/2019 Elsevier Patient Education  [place].

## 2020-06-23 NOTE — Progress Notes (Signed)
50 y.o. G62P1001 Divorced White or Caucasian female here for annual exam.     Wants to discuss hot flashes. In the last 3 months has had a lot of hot flashes, night sweats. Taking OTC supplements but they are not helping anymore.Very disruptive to her. S/P hyst, still has ovaries  Patient's last menstrual period was 08/06/2011 (exact date).          Sexually active: Yes.    The current method of family planning is status post hysterectomy.    Exercising: Yes.    5 days a week Smoker:  no  Health Maintenance: Pap:  05-15-17 neg HPV HR neg (1/19 vaginal biopsy) History of abnormal Pap:  yes MMG:  07-09-2019 bilateral & 07-15-2019 left breast imaging category c density birads 2:neg Colonoscopy:  07-06-2019 BMD:  none TDaP: 2020 Gardasil:   n/a Covid-19: not done (had covid infection) Hep C testing: not done Screening Labs: to do today   reports that she has never smoked. She has never used smokeless tobacco. She reports current alcohol use of about 6.0 - 7.0 standard drinks of alcohol per week. She reports that she does not use drugs.  Past Medical History:  Diagnosis Date  . Abnormal Pap smear of cervix   . Anemia   . Fibroid   . GERD (gastroesophageal reflux disease)   . Headache(784.0)   . 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 PONV (postoperative nausea and vomiting)   . STD (sexually transmitted disease) 05/2008   HSV I - by C & S and IgG (labial)    Past Surgical History:  Procedure Laterality Date  . CERVICAL BIOPSY  W/ LOOP ELECTRODE EXCISION    . CESAREAN SECTION  1997  . COLPOSCOPY    . ROBOTIC ASSISTED LAPAROSCOPIC HYSTERECTOMY AND SALPINGECTOMY  08/06/11   fibroids partial hysterectomy    Current Outpatient Medications  Medication Sig Dispense Refill  . ALBUTEROL SULFATE HFA IN Inhale into the lungs.    Marland Kitchen BLACK COHOSH PO Take by mouth.    . nitrofurantoin, macrocrystal-monohydrate, (MACROBID) 100 MG capsule TAKE ONE CAPSULE AFTER COITUS, MAY REPEAT AGAIN 12  HOURS IF NEEDED. Needs appointment for future refills. 30 capsule 0  . Nutritional Supplements (ESTROVEN WEIGHT MANAGEMENT PO) Take by mouth.    Marland Kitchen olopatadine (PATANOL) 0.1 % ophthalmic solution Place 2 drops into both eyes at bedtime.     Earney Navy Bicarbonate (ZEGERID PO) Take by mouth.    Marland Kitchen oxymetazoline (AFRIN) 0.05 % nasal spray Place 1 spray into both nostrils as needed.    . Probiotic Product (PROBIOTIC PO) Take by mouth as needed.     . SUMAtriptan (IMITREX) 100 MG tablet     . valACYclovir (VALTREX) 500 MG tablet TAKE 1 TABLET BY MOUTH TWICE A DAY X 3 DAYS AT ONSET OF OUTBREAKS 30 tablet 11   No current facility-administered medications for this visit.    Family History  Problem Relation Age of Onset  . Hypertension Father   . Cancer Maternal Grandmother        tumor  on the brain?    Review of Systems  Constitutional: Negative.        Hot flashes, night sweats, weight gain, vaginal dryness  HENT: Negative.   Eyes: Negative.   Respiratory: Negative.   Cardiovascular: Negative.   Gastrointestinal: Negative.   Endocrine: Negative.   Genitourinary: Negative.   Musculoskeletal: Negative.   Skin: Negative.   Allergic/Immunologic: Negative.   Neurological: Negative.  Hematological: Negative.   Psychiatric/Behavioral: Negative.     Exam:   BP 114/70   Pulse 68   Resp 16   Ht 5' 8.25" (1.734 m)   Wt 149 lb (67.6 kg)   LMP 08/06/2011 (Exact Date)   BMI 22.49 kg/m   Height: 5' 8.25" (173.4 cm)  General appearance: alert, cooperative and appears stated age, no acute distress Head: Normocephalic, without obvious abnormality Neck: no adenopathy, thyroid normal to inspection and palpation Lungs: clear to auscultation bilaterally Breasts: No axillary or supraclavicular adenopathy, Normal to palpation without dominant masses. Left breast palpates firm superior to nipple, no discernable mass. Reviewed breast imaging from last year, additional imaging done in  that area Heart: regular rate and rhythm Abdomen: soft, non-tender; no masses,  no organomegaly Extremities: extremities normal, no edema Skin: No rashes or lesions Lymph nodes: Cervical, supraclavicular, and axillary nodes normal. No abnormal inguinal nodes palpated Neurologic: Grossly normal   Pelvic: External genitalia:  no lesions              Urethra:  normal appearing urethra with no masses, tenderness or lesions              Bartholins and Skenes: normal                 Vagina: normal appearing vagina, appropriate for age, normal appearing discharge, no lesions              Cervix: absent             Bimanual Exam:   Uterus:  uterus absent              Adnexa: no mass, fullness, tenderness                 Joy, CMA Chaperone was present for exam.  A:  Well Woman with normal exam  Well woman exam with routine gynecological exam - Plan: Lipid Profile, CBC, Comp Met (CMET), TSH  Hot flashes - Plan: estradiol (VIVELLE-DOT) 0.05 MG/24HR patch  P:   Pap :n/a s/p hyst  Mammogram: scheduled for 07/15/2019  Labs:CBC, lipid panel, CMP, tsh  Medications: estradiol 0.05 twice weekly

## 2020-06-24 LAB — COMPREHENSIVE METABOLIC PANEL
AG Ratio: 1.7 (calc) (ref 1.0–2.5)
ALT: 17 U/L (ref 6–29)
AST: 21 U/L (ref 10–35)
Albumin: 4.5 g/dL (ref 3.6–5.1)
Alkaline phosphatase (APISO): 64 U/L (ref 37–153)
BUN: 16 mg/dL (ref 7–25)
CO2: 28 mmol/L (ref 20–32)
Calcium: 9.6 mg/dL (ref 8.6–10.4)
Chloride: 102 mmol/L (ref 98–110)
Creat: 0.74 mg/dL (ref 0.50–1.05)
Globulin: 2.6 g/dL (calc) (ref 1.9–3.7)
Glucose, Bld: 90 mg/dL (ref 65–99)
Potassium: 4 mmol/L (ref 3.5–5.3)
Sodium: 138 mmol/L (ref 135–146)
Total Bilirubin: 0.8 mg/dL (ref 0.2–1.2)
Total Protein: 7.1 g/dL (ref 6.1–8.1)

## 2020-06-24 LAB — LIPID PANEL
Cholesterol: 197 mg/dL (ref <200)
HDL: 83 mg/dL (ref >=50)
LDL Cholesterol (Calc): 99 mg/dL (calc)
Non-HDL Cholesterol (Calc): 114 mg/dL (calc) (ref <130)
Total CHOL/HDL Ratio: 2.4 (calc) (ref <5.0)
Triglycerides: 60 mg/dL (ref <150)

## 2020-06-24 LAB — TSH: TSH: 1.48 mIU/L

## 2020-06-24 LAB — CBC
HCT: 38.3 % (ref 35.0–45.0)
Hemoglobin: 12.7 g/dL (ref 11.7–15.5)
MCH: 31.4 pg (ref 27.0–33.0)
MCHC: 33.2 g/dL (ref 32.0–36.0)
MCV: 94.6 fL (ref 80.0–100.0)
MPV: 10.4 fL (ref 7.5–12.5)
Platelets: 300 10*3/uL (ref 140–400)
RBC: 4.05 10*6/uL (ref 3.80–5.10)
RDW: 11.8 % (ref 11.0–15.0)
WBC: 5.4 10*3/uL (ref 3.8–10.8)

## 2020-06-28 ENCOUNTER — Other Ambulatory Visit: Payer: Self-pay

## 2020-07-06 NOTE — Telephone Encounter (Signed)
Spoke with patient and informed her of My Chart message that returned unread.

## 2020-07-15 ENCOUNTER — Telehealth: Payer: Self-pay | Admitting: *Deleted

## 2020-07-15 NOTE — Telephone Encounter (Signed)
Order signed and faxed. Unable to leave message for patient to schedule. Voicemail full

## 2020-07-15 NOTE — Telephone Encounter (Signed)
Patient was seen for annual exam on 06/23/20 had mammogram appointment at [facility] yesterday. Patient told the tech to make sure she checks the left breast due to history of left breast problem as you recommended. Reports the tech told patient she will need to have a diag. Mammogram if she would like this done. So her mammogram had been canceled and now needs order for diag. Mammogram due to mentioning this.  Okay to send order?

## 2020-07-15 NOTE — Telephone Encounter (Signed)
Jillian Francis told me verbally okay to fax order. Order placed on her desk to sign.

## 2020-08-22 ENCOUNTER — Encounter: Payer: Self-pay | Admitting: Nurse Practitioner

## 2020-09-01 ENCOUNTER — Telehealth: Payer: Self-pay | Admitting: *Deleted

## 2020-09-01 DIAGNOSIS — R232 Flushing: Secondary | ICD-10-CM

## 2020-09-01 MED ORDER — ESTRADIOL 0.05 MG/24HR TD PTTW: 1.0000 | MEDICATED_PATCH | TRANSDERMAL | 3 refills | Status: DC

## 2020-09-01 NOTE — Telephone Encounter (Signed)
Patient called and said she has a new mail order pharmacy and needs vivelle dot patch sent to mail order. Rx sent with refill until annual exam in 2023. Patient aware.

## 2020-10-20 ENCOUNTER — Other Ambulatory Visit: Payer: Self-pay

## 2020-10-20 DIAGNOSIS — N390 Urinary tract infection, site not specified: Secondary | ICD-10-CM

## 2020-10-20 DIAGNOSIS — Z87440 Personal history of urinary (tract) infections: Secondary | ICD-10-CM

## 2020-10-20 MED ORDER — NITROFURANTOIN MONOHYD MACRO 100 MG PO CAPS
ORAL_CAPSULE | ORAL | 0 refills | Status: DC
Start: 2020-10-20 — End: 2021-02-27

## 2020-10-20 NOTE — Telephone Encounter (Signed)
Requesting refill on Macrobid.  AEX was 06/23/20.

## 2020-11-28 ENCOUNTER — Telehealth: Payer: Self-pay | Admitting: *Deleted

## 2020-11-28 NOTE — Telephone Encounter (Signed)
Patient called requesting refill on vivelle dot patch 0.05 mg , reports [facility] did not have Rx. On 09/01/20 90 day supply with 3 refills was sent to [facility]. I called and they do have Rx and will send out Rx in 3-5 business days. Patient aware.

## 2020-12-14 ENCOUNTER — Telehealth: Payer: Self-pay | Admitting: Cardiology

## 2020-12-14 NOTE — Telephone Encounter (Signed)
New Message:     Patient's parents are patients of Dr Stanford Breed. She wants to know if Dr Stanford Breed will see her as a New Pt. She says she does not have a primary doctor.

## 2020-12-14 NOTE — Telephone Encounter (Signed)
This is NOT an established patient of Dr. Stanford Breed. Her parents see Dr. Stanford Breed. She would like to see him for cardiology care. She has some concerns she would like discuss with MD

## 2021-02-27 ENCOUNTER — Telehealth: Payer: Self-pay

## 2021-02-27 DIAGNOSIS — N390 Urinary tract infection, site not specified: Secondary | ICD-10-CM

## 2021-02-27 DIAGNOSIS — Z87440 Personal history of urinary (tract) infections: Secondary | ICD-10-CM

## 2021-02-27 MED ORDER — NITROFURANTOIN MONOHYD MACRO 100 MG PO CAPS: ORAL_CAPSULE | ORAL | 1 refills | Status: DC

## 2021-02-27 NOTE — Telephone Encounter (Signed)
Ok for Macrobid 100 mg. Take 1 po x 1 with intercourse.  Disp:  30 RF:  one

## 2021-02-27 NOTE — Telephone Encounter (Signed)
Refill sent.

## 2021-02-27 NOTE — Telephone Encounter (Signed)
05/18/19 Jillian Francis, CNM wrote in visit not "History of post coital Macrobid working well".  Jillian Ganja, NP saw patient 06/23/20 for AEX and refilled the Macrobid for #30 w no refills on 10/20/20.

## 2021-03-08 ENCOUNTER — Other Ambulatory Visit: Payer: Self-pay | Admitting: *Deleted

## 2021-03-08 DIAGNOSIS — N390 Urinary tract infection, site not specified: Secondary | ICD-10-CM

## 2021-03-08 DIAGNOSIS — Z87440 Personal history of urinary (tract) infections: Secondary | ICD-10-CM

## 2021-06-22 NOTE — Progress Notes (Signed)
51 y.o. G81P1001 Divorced White or Caucasian Not Hispanic or Latino female here for annual exam.  H/O hysterectomy.  Sexually active, live together. No dyspareunia.  She is having some hot flashes, she was on the patch. She went off of the patch secondary to concerns of side effects. Now with minimal symptoms.    She is having issues with constipation. She is taking metamucil qd. Having small BM's 1-2 x a week. This has been an issue for 4-5 years. She had a normal colonoscopy.   No bladder issues.   Patient's last menstrual period was 08/06/2011 (exact date).          Sexually active: Yes.    The current method of family planning is status post hysterectomy.    Exercising: Yes.     Cardio, weights and hiking  Smoker:  no  Health Maintenance: Pap:  05-15-17 neg HPV HR neg (1/19 vaginal biopsy for visual lesion, benign) History of abnormal Pap:  she had a leep in her 20's.  MMG:  08/22/20 U/S Bi-rads 2 benign  BMD:   none  Colonoscopy: 07/06/19, 3 polyps, f/u in 5-7 years.  TDaP:  05/18/2019 Gardasil: n/a   reports that she has never smoked. She has never used smokeless tobacco. She reports current alcohol use of about 6.0 - 7.0 standard drinks per week. She reports that she does not use drugs. She works in Therapist, art at [employer]. She is moving to a different position in [place]. Daughter is 31, lives in [place].   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     nitrofurantoin, macrocrystal-monohydrate, (MACROBID) 100 MG capsule Take one capsule po with intercourse. 30 capsule 1   olopatadine (PATANOL) 0.1 % ophthalmic solution Place 2 drops into both eyes at bedtime.      Omeprazole-Sodium Bicarbonate (ZEGERID PO) Take by mouth.     oxymetazoline (AFRIN) 0.05 % nasal spray Place 1 spray into both nostrils as needed.     Probiotic Product (PROBIOTIC PO) Take by mouth as needed.      valACYclovir (VALTREX) 500 MG tablet TAKE 1 TABLET BY MOUTH TWICE A DAY X 3 DAYS AT ONSET OF OUTBREAKS 30 tablet 11   No current facility-administered medications for this visit.    Family History  Problem Relation Age of Onset   Hypertension Father    Cancer Maternal Grandmother        tumor  on the brain?    Review of Systems  All other systems reviewed and are negative.  Exam:   BP 122/66    Pulse 60    Ht 5' 8.5" (1.74 m)    Wt 152 lb (68.9 kg)    LMP 08/06/2011 (Exact Date)  SpO2 98%    BMI 22.78 kg/m   Weight change: @WEIGHTCHANGE @ Height:   Height: 5' 8.5" (174 cm)  Ht Readings from Last 3 Encounters:  06/26/21 5' 8.5" (1.74 m)  06/23/20 5' 8.25" (1.734 m)  05/18/19 5' 7.75" (1.721 m)    General appearance: alert, cooperative and appears stated age Head: Normocephalic, without obvious abnormality, atraumatic Neck: no adenopathy, supple, symmetrical, trachea midline and thyroid normal to inspection and palpation Lungs: clear to auscultation bilaterally Cardiovascular: regular rate and rhythm Breasts: normal appearance, no masses or tenderness Abdomen: soft, non-tender; non distended,  no masses,  no organomegaly Extremities: extremities normal, atraumatic, no cyanosis or edema Skin: Skin color, texture, turgor normal. No rashes or lesions Lymph nodes: Cervical, supraclavicular, and axillary nodes normal. No  abnormal inguinal nodes palpated Neurologic: Grossly normal   Pelvic: External genitalia:  no lesions              Urethra:  normal appearing urethra with no masses, tenderness or lesions              Bartholins and Skenes: normal                 Vagina: normal appearing vagina with normal color and discharge, no lesions              Cervix: absent               Bimanual Exam:  Uterus:  uterus absent              Adnexa: no mass, fullness, tenderness               Rectovaginal: Confirms               Anus:  normal sphincter tone, no lesions  Gae Dry chaperoned for the exam.  1. Well woman exam Discussed breast self exam Discussed calcium and vit D intake Mammogram due in 3/23 Colonoscopy UTD Labs through work  2. History of herpes genitalis Rare outbreaks - valACYclovir (VALTREX) 500 MG tablet; TAKE 1 TABLET BY MOUTH TWICE A DAY X 3 DAYS AT ONSET OF OUTBREAKS  Dispense: 30 tablet; Refill: 11  3. Postcoital UTI - nitrofurantoin, macrocrystal-monohydrate, (MACROBID) 100 MG capsule; Take one capsule po with intercourse.  Dispense: 30 capsule; Refill: 1  4. Screening for vaginal cancer - Cytology - PAP

## 2021-06-26 ENCOUNTER — Other Ambulatory Visit (HOSPITAL_COMMUNITY)
Admission: RE | Admit: 2021-06-26 | Discharge: 2021-06-26 | Disposition: A | Discharge status: Home / Self Care | Payer: BC Managed Care – PPO | Source: Ambulatory Visit | Attending: Nurse Practitioner | Admitting: Nurse Practitioner

## 2021-06-26 ENCOUNTER — Other Ambulatory Visit: Payer: Self-pay

## 2021-06-26 ENCOUNTER — Ambulatory Visit (INDEPENDENT_AMBULATORY_CARE_PROVIDER_SITE_OTHER): Payer: BC Managed Care – PPO | Admitting: Obstetrics and Gynecology

## 2021-06-26 ENCOUNTER — Ambulatory Visit: Payer: BC Managed Care – PPO | Admitting: Nurse Practitioner

## 2021-06-26 ENCOUNTER — Encounter: Payer: Self-pay | Admitting: Obstetrics and Gynecology

## 2021-06-26 VITALS — BP 122/66 | HR 60 | Ht 68.5 in | Wt 152.0 lb

## 2021-06-26 DIAGNOSIS — D225 Melanocytic nevi of trunk: Secondary | ICD-10-CM | POA: Insufficient documentation

## 2021-06-26 DIAGNOSIS — R140 Abdominal distension (gaseous): Secondary | ICD-10-CM | POA: Insufficient documentation

## 2021-06-26 DIAGNOSIS — L814 Other melanin hyperpigmentation: Secondary | ICD-10-CM | POA: Insufficient documentation

## 2021-06-26 DIAGNOSIS — Z1272 Encounter for screening for malignant neoplasm of vagina: Secondary | ICD-10-CM | POA: Diagnosis present

## 2021-06-26 DIAGNOSIS — Z411 Encounter for cosmetic surgery: Secondary | ICD-10-CM | POA: Insufficient documentation

## 2021-06-26 DIAGNOSIS — Z87898 Personal history of other specified conditions: Secondary | ICD-10-CM | POA: Insufficient documentation

## 2021-06-26 DIAGNOSIS — K5904 Chronic idiopathic constipation: Secondary | ICD-10-CM | POA: Insufficient documentation

## 2021-06-26 DIAGNOSIS — K219 Gastro-esophageal reflux disease without esophagitis: Secondary | ICD-10-CM | POA: Insufficient documentation

## 2021-06-26 DIAGNOSIS — Z01419 Encounter for gynecological examination (general) (routine) without abnormal findings: Secondary | ICD-10-CM | POA: Diagnosis not present

## 2021-06-26 DIAGNOSIS — Z8619 Personal history of other infectious and parasitic diseases: Secondary | ICD-10-CM | POA: Diagnosis not present

## 2021-06-26 DIAGNOSIS — Z1211 Encounter for screening for malignant neoplasm of colon: Secondary | ICD-10-CM | POA: Insufficient documentation

## 2021-06-26 DIAGNOSIS — N390 Urinary tract infection, site not specified: Secondary | ICD-10-CM | POA: Diagnosis not present

## 2021-06-26 DIAGNOSIS — B079 Viral wart, unspecified: Secondary | ICD-10-CM | POA: Insufficient documentation

## 2021-06-26 MED ORDER — VALACYCLOVIR HCL 500 MG PO TABS: ORAL_TABLET | ORAL | 11 refills | Status: DC

## 2021-06-26 MED ORDER — NITROFURANTOIN MONOHYD MACRO 100 MG PO CAPS: ORAL_CAPSULE | ORAL | 1 refills | Status: DC

## 2021-06-26 NOTE — Patient Instructions (Signed)
Magnesium 500 mg a day for constipation.  About Constipation  Constipation Overview Constipation is the most common gastrointestinal complaint -- about 4 million Americans experience constipation and make 2.5 million physician visits a year to get help for the problem.  Constipation can occur when the colon absorbs too much water, the colons muscle contraction is slow or sluggish, and/or there is delayed transit time through the colon.  The result is stool that is hard and dry.  Indicators of constipation include straining during bowel movements greater than 25% of the time, having fewer than three bowel movements per week, and/or the feeling of incomplete evacuation.  There are established guidelines ([place] II ) for defining constipation. A person needs to have two or more of the following symptoms for at least 12 weeks (not necessarily consecutive) in the preceding 12 months: Straining in  greater than 25% of bowel movements Lumpy or hard stools in greater than 25% of bowel movements Sensation of incomplete emptying in greater than 25% of bowel movements Sensation of anorectal obstruction/blockade in greater than 25% of bowel movements Manual maneuvers to help empty greater than 25% of bowel movements (e.g., digital evacuation, support of the pelvic floor)  Less than  3 bowel movements/week Loose stools are not present, and criteria for irritable bowel syndrome are insufficient  Common Causes of Constipation Lack of fiber in your diet Lack of physical activity Medications, including iron and calcium supplements  Dairy intake Dehydration Abuse of laxatives Travel Irritable Bowel Syndrome Pregnancy Luteal phase of menstruation (after ovulation and before menses) Colorectal problems Intestinal Dysfunction  Treating Constipation  There are several ways of treating constipation, including changes to diet and exercise, use of laxatives, adjustments to the pelvic floor, and scheduled  toileting.  These treatments include: increasing fiber and fluids in the diet  increasing physical activity learning muscle coordination  learning proper toileting techniques and toileting modifications  designing and sticking  to a toileting schedule     2007, [employer] Doc.22  EXERCISE   We recommended that you start or continue a regular exercise program for good health. Physical activity is anything that gets your body moving, some is better than none. The CDC recommends 150 minutes per week of Moderate-Intensity Aerobic Activity and 2 or more days of Muscle Strengthening Activity.  Benefits of exercise are limitless: helps weight loss/weight maintenance, improves mood and energy, helps with depression and anxiety, improves sleep, tones and strengthens muscles, improves balance, improves bone density, protects from chronic conditions such as heart disease, high blood pressure and diabetes and so much more. To learn more visit: WhyNotPoker.uy  DIET: Good nutrition starts with a healthy diet of fruits, vegetables, whole grains, and lean protein sources. Drink plenty of water for hydration. Minimize empty calories, sodium, sweets. For more information about dietary recommendations visit: GeekRegister.com.ee and http://schaefer-mitchell.com/  ALCOHOL:  Women should limit their alcohol intake to no more than 7 drinks/beers/glasses of wine (combined, not each!) per week. Moderation of alcohol intake to this level decreases your risk of breast cancer and liver damage.  If you are concerned that you may have a problem, or your friends have told you they are concerned about your drinking, there are many resources to help. A well-known program that is free, effective, and available to all people all over the nation is Alcoholics Anonymous.  Check out this site to learn more: BlockTaxes.se   CALCIUM  AND VITAMIN D:  Adequate intake of calcium and Vitamin D are recommended for bone  health.  You should be getting between 1000-1200 mg of calcium and 800 units of Vitamin D daily between diet and supplements  PAP SMEARS:  Pap smears, to check for cervical cancer or precancers,  have traditionally been done yearly, scientific advances have shown that most women can have pap smears less often.  However, every woman still should have a physical exam from her gynecologist every year. It will include a breast check, inspection of the vulva and vagina to check for abnormal growths or skin changes, a visual exam of the cervix, and then an exam to evaluate the size and shape of the uterus and ovaries. We will also provide age appropriate advice regarding health maintenance, like when you should have certain vaccines, screening for sexually transmitted diseases, bone density testing, colonoscopy, mammograms, etc.   MAMMOGRAMS:  All women over 19 years old should have a routine mammogram.   COLON CANCER SCREENING: Now recommend starting at age 44. At this time colonoscopy is not covered for routine screening until 50. There are take home tests that can be done between 45-49.   COLONOSCOPY:  Colonoscopy to screen for colon cancer is recommended for all women at age 38.  We know, you hate the idea of the prep.  We agree, BUT, having colon cancer and not knowing it is worse!!  Colon cancer so often starts as a polyp that can be seen and removed at colonscopy, which can quite literally save your life!  And if your first colonoscopy is normal and you have no family history of colon cancer, most women don't have to have it again for 10 years.  Once every ten years, you can do something that may end up saving your life, right?  We will be happy to help you get it scheduled when you are ready.  Be sure to check your insurance coverage so you understand how much it will cost.  It may be covered as a preventative service at no  cost, but you should check your particular policy.      Breast Self-Awareness Breast self-awareness means being familiar with how your breasts look and feel. It involves checking your breasts regularly and reporting any changes to your health care provider. Practicing breast self-awareness is important. A change in your breasts can be a sign of a serious medical problem. Being familiar with how your breasts look and feel allows you to find any problems early, when treatment is more likely to be successful. All women should practice breast self-awareness, including women who have had breast implants. How to do a breast self-exam One way to learn what is normal for your breasts and whether your breasts are changing is to do a breast self-exam. To do a breast self-exam: Look for Changes  Remove all the clothing above your waist. Stand in front of a mirror in a room with good lighting. Put your hands on your hips. Push your hands firmly downward. Compare your breasts in the mirror. Look for differences between them (asymmetry), such as: Differences in shape. Differences in size. Puckers, dips, and bumps in one breast and not the other. Look at each breast for changes in your skin, such as: Redness. Scaly areas. Look for changes in your nipples, such as: Discharge. Bleeding. Dimpling. Redness. A change in position. Feel for Changes Carefully feel your breasts for lumps and changes. It is best to do this while lying on your back on the floor and again while sitting or standing in the shower  or tub with soapy water on your skin. Feel each breast in the following way: Place the arm on the side of the breast you are examining above your head. Feel your breast with the other hand. Start in the nipple area and make  inch (2 cm) overlapping circles to feel your breast. Use the pads of your three middle fingers to do this. Apply light pressure, then medium pressure, then firm pressure. The light  pressure will allow you to feel the tissue closest to the skin. The medium pressure will allow you to feel the tissue that is a little deeper. The firm pressure will allow you to feel the tissue close to the ribs. Continue the overlapping circles, moving downward over the breast until you feel your ribs below your breast. Move one finger-width toward the center of the body. Continue to use the  inch (2 cm) overlapping circles to feel your breast as you move slowly up toward your collarbone. Continue the up and down exam using all three pressures until you reach your armpit.  Write Down What You Find  Write down what is normal for each breast and any changes that you find. Keep a written record with breast changes or normal findings for each breast. By writing this information down, you do not need to depend only on memory for size, tenderness, or location. Write down where you are in your menstrual cycle, if you are still menstruating. If you are having trouble noticing differences in your breasts, do not get discouraged. With time you will become more familiar with the variations in your breasts and more comfortable with the exam. How often should I examine my breasts? Examine your breasts every month. If you are breastfeeding, the best time to examine your breasts is after a feeding or after using a breast pump. If you menstruate, the best time to examine your breasts is 5-7 days after your period is over. During your period, your breasts are lumpier, and it may be more difficult to notice changes. When should I see my health care provider? See your health care provider if you notice: A change in shape or size of your breasts or nipples. A change in the skin of your breast or nipples, such as a reddened or scaly area. Unusual discharge from your nipples. A lump or thick area that was not there before. Pain in your breasts. Anything that concerns you.

## 2021-06-27 LAB — CYTOLOGY - PAP
Comment: NEGATIVE
Diagnosis: NEGATIVE
High risk HPV: NEGATIVE

## 2022-02-01 ENCOUNTER — Encounter: Payer: Self-pay | Admitting: Obstetrics and Gynecology

## 2022-02-09 ENCOUNTER — Ambulatory Visit: Payer: BC Managed Care – PPO | Admitting: Radiology

## 2022-02-09 ENCOUNTER — Encounter: Payer: Self-pay | Admitting: Radiology

## 2022-02-09 VITALS — BP 104/62 | Ht 67.5 in | Wt 149.6 lb

## 2022-02-09 DIAGNOSIS — K5904 Chronic idiopathic constipation: Secondary | ICD-10-CM | POA: Diagnosis not present

## 2022-02-09 DIAGNOSIS — R102 Pelvic and perineal pain: Secondary | ICD-10-CM

## 2022-02-09 DIAGNOSIS — N951 Menopausal and female climacteric states: Secondary | ICD-10-CM | POA: Diagnosis not present

## 2022-02-09 DIAGNOSIS — N958 Other specified menopausal and perimenopausal disorders: Secondary | ICD-10-CM

## 2022-02-09 MED ORDER — ESTRADIOL 0.05 MG/24HR TD PTTW: 1.0000 | MEDICATED_PATCH | TRANSDERMAL | 2 refills | Status: DC

## 2022-02-09 MED ORDER — ESTRADIOL 10 MCG VA TABS: 1.0000 | ORAL_TABLET | VAGINAL | 2 refills | Status: DC

## 2022-02-09 NOTE — Progress Notes (Signed)
   Lamerle Jabs 10/03/1970 [id number]   History:  51 y.o. G1P1 s/p hyst 10 years ago for fibroids presents with c/o LLQ pain, worse with constipation. Moving bowels every 4-5 days. Vaginal dryness and itching at times. No urinary symptoms. Reports abdominal bloating, hot flashes, weight gain. Using OTC black cohosh and evening primrose with some effect. Took estradiol patches 2 years ago but stopped because she was scare of cancer risk being increased. Interested in restarting.   Gynecologic History Hysterectomy: fibroids 2013 Sexually active: yes, monogamous, prone to UTIs  Health Maintenance Last Pap: 2023. Results were: normal Last mammogram: 02/01/22. Results were: normal   Past medical history, past surgical history, family history and social history were all reviewed and documented in the EPIC chart.  ROS:  A ROS was performed and pertinent positives and negatives are included.  Exam:  Vitals:   02/09/22 0847  BP: 104/62  Weight: 149 lb 9.6 oz (67.9 kg)  Height: 5' 7.5" (1.715 m)   Body mass index is 23.08 kg/m.  General appearance:  Normal Abdominal  Soft,nontender, without masses, guarding or rebound.  Liver/spleen:  No organomegaly noted  Hernia:  None appreciated  Skin  Inspection:  Grossly normal Genitourinary   Inguinal/mons:  Normal without inguinal adenopathy  External genitalia:  Normal appearing vulva with no masses, tenderness, or lesions  BUS/Urethra/Skene's glands:  Normal  Vagina:  Atrophic, no d/c  Cervix:  absent  Uterus:  absent  Adnexa/parametria:     Rt: Normal in size, without masses or tenderness.   Lt: Normal in size, without masses.+ tenderness LLQ, stool burden appreciated  Anus and perineum: Normal  Digital rectal exam: Normal sphincter tone without palpated masses or tenderness  Patient informed chaperone available to be present for breast and pelvic exam. Patient has requested no chaperone to be present. Patient has been advised what  will be completed during breast and pelvic exam.   Assessment/Plan:   1. Pelvic pain - Urinalysis,Complete w/RFL Culture - US Transvaginal Non-OB; Future  2. Genitourinary syndrome of menopause - Estradiol (YUVAFEM) 10 MCG TABS vaginal tablet; Place 1 tablet (10 mcg total) vaginally 2 (two) times a week.  Dispense: 24 tablet; Refill: 2  3. Vasomotor symptoms due to menopause - estradiol (VIVELLE-DOT) 0.05 MG/24HR patch; Place 1 patch (0.05 mg total) onto the skin 2 (two) times a week.  Dispense: 24 patch; Refill: 2 Reassured patches are considerably safer than oral estradiol, information provided  4. Chronic idiopathic constipation Magnesium nightly   Increase fiber    Rubbie Battiest B WHNP-BC 9:32 AM 02/09/2022

## 2022-02-11 LAB — URINALYSIS, COMPLETE W/RFL CULTURE
Bilirubin Urine: NEGATIVE
Casts: NONE SEEN /LPF
Crystals: NONE SEEN /HPF
Glucose, UA: NEGATIVE
Hgb urine dipstick: NEGATIVE
Hyaline Cast: NONE SEEN /LPF
Ketones, ur: NEGATIVE
Leukocyte Esterase: NEGATIVE
Nitrites, Initial: NEGATIVE
Protein, ur: NEGATIVE
RBC / HPF: NONE SEEN /HPF (ref 0–2)
Specific Gravity, Urine: 1.02 (ref 1.001–1.035)
WBC, UA: NONE SEEN /HPF (ref 0–5)
pH: 5.5 (ref 5.0–8.0)

## 2022-02-11 LAB — URINE CULTURE
MICRO NUMBER:: 13891119
Result:: NO GROWTH
SPECIMEN QUALITY:: ADEQUATE

## 2022-02-11 LAB — CULTURE INDICATED

## 2022-03-20 ENCOUNTER — Other Ambulatory Visit: Payer: BC Managed Care – PPO

## 2022-03-20 ENCOUNTER — Other Ambulatory Visit: Payer: BC Managed Care – PPO | Admitting: Radiology

## 2022-06-28 ENCOUNTER — Ambulatory Visit: Payer: BC Managed Care – PPO | Admitting: Radiology

## 2022-06-28 ENCOUNTER — Ambulatory Visit: Payer: BC Managed Care – PPO | Admitting: Obstetrics and Gynecology

## 2022-07-17 ENCOUNTER — Ambulatory Visit: Payer: BC Managed Care – PPO | Admitting: Radiology

## 2022-07-18 ENCOUNTER — Telehealth: Payer: Self-pay

## 2022-07-18 ENCOUNTER — Encounter: Payer: Self-pay | Admitting: Radiology

## 2022-07-18 ENCOUNTER — Ambulatory Visit (INDEPENDENT_AMBULATORY_CARE_PROVIDER_SITE_OTHER): Payer: BC Managed Care – PPO | Admitting: Radiology

## 2022-07-18 VITALS — BP 116/72 | Ht 67.5 in | Wt 141.0 lb

## 2022-07-18 DIAGNOSIS — Z01419 Encounter for gynecological examination (general) (routine) without abnormal findings: Secondary | ICD-10-CM | POA: Diagnosis not present

## 2022-07-18 NOTE — Telephone Encounter (Signed)
Order faxed to [facility]. Appt scheduled for Monday, Feb 19 at 8:45am at [facility] location in [place].  I spoke with patient and informed her of date/time. 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 I provided her the phone number so she can call and reschedule to a time that will fit her schedule.

## 2022-07-18 NOTE — Progress Notes (Signed)
   Jillian Francis 08-Apr-1971 [id number]   History:  52 y.o. G1P1 presents for annual exam. S/p hyst. Doing well on patches, still has some breakthrough hot flashes, unsure if she wants to increase dose, just refilled 3 month supply. Happy with yuvafem, vaginal dryness and dyspareunia resolved. C/O pain and mass in lower left breast.   Gynecologic History Hysterectomy Sexually active: yes  Health Maintenance Last Pap: 06/26/21. Results were: normal Last mammogram: 02/01/22. Results were: normal Last colonoscopy: 2021. Results were: normal Last Dexa: current  Past medical history, past surgical history, family history and social history were all reviewed and documented in the EPIC chart.  ROS:  A ROS was performed and pertinent positives and negatives are included.  Exam:  Vitals:   07/18/22 0828  BP: 116/72  Weight: 141 lb (64 kg)  Height: 5' 7.5" (1.715 m)   Body mass index is 21.76 kg/m.  General appearance:  Normal Thyroid:  Symmetrical, normal in size, without palpable masses or nodularity. Respiratory  Auscultation:  Clear without wheezing or rhonchi Cardiovascular  Auscultation:  Regular rate, without rubs, murmurs or gallops  Edema/varicosities:  Not grossly evident Abdominal  Soft,nontender, without masses, guarding or rebound.  Liver/spleen:  No organomegaly noted  Hernia:  None appreciated  Skin  Inspection:  Grossly normal Breasts: Examined lying and sitting.   Right: Without masses, retractions, nipple discharge or axillary adenopathy.   Left: +mass 5 oclock 2cm, mobile, tender.Without retractions, nipple discharge or axillary adenopathy. Genitourinary   Inguinal/mons:  Normal without inguinal adenopathy  External genitalia:  Normal appearing vulva with no masses, tenderness, or lesions  BUS/Urethra/Skene's glands:  Normal  Vagina:  Normal appearing with normal color and discharge, no lesions. Atrophy mild  Cervix:  absent  Uterus:   absent  Adnexa/parametria:     Rt: Normal in size, without masses or tenderness.   Lt: Normal in size, without masses or tenderness.  Anus and perineum: Normal    Patient informed chaperone available to be present for breast and pelvic exam. Patient has requested no chaperone to be present. Patient has been advised what will be completed during breast and pelvic exam.   Assessment/Plan:   1. Well woman exam with routine gynecological exam Just filled estrogen patches and yuvafem, will finish this 90 day supply and let me know if she wants to increase the dose if hot flashes continue     Discussed SBE, colonoscopy and DEXA screening as appropriate. Encouraged 190mns/week of cardiovascular and weight bearing exercise minimum. Recommend the use of seatbelts and sunscreen consistently.   Return in 1 year for annual or sooner prn.  CRubbie BattiestB WHNP-BC 10:31 AM 07/18/2022

## 2022-07-18 NOTE — Telephone Encounter (Signed)
Dx mammogram Received: Today Chrzanowski, Annitta Needs, NP  [facility] Triage Left breast mass 5 oclock, mobile, tender. Please schedule dx mammogram and ultrasound (typically goes to [facility])

## 2022-07-31 ENCOUNTER — Other Ambulatory Visit: Payer: Self-pay | Admitting: Obstetrics and Gynecology

## 2022-07-31 DIAGNOSIS — N390 Urinary tract infection, site not specified: Secondary | ICD-10-CM

## 2022-08-01 NOTE — Telephone Encounter (Signed)
Last AEX 07/18/2022--recall placed for 2025.  Prescribed in past for prevention of postcoital UTIs.   Rx pend.

## 2022-08-02 ENCOUNTER — Encounter: Payer: Self-pay | Admitting: Radiology

## 2022-09-27 ENCOUNTER — Other Ambulatory Visit: Payer: Self-pay | Admitting: Radiology

## 2022-09-27 DIAGNOSIS — N951 Menopausal and female climacteric states: Secondary | ICD-10-CM

## 2022-09-27 NOTE — Telephone Encounter (Signed)
Last AEX 07/18/2022--recall placed for 2025. Last mammo 01/29/2022-neg birads 1; Dx/Us Lt Breast d/t lump 08/01/2022-neg birads 1  It was discussed @ AEX that pt needed to contact us when refill was needed d/t reporting if she desired increase in dose of patch for breakthrough hot flashes-will send pt mychart msg.

## 2022-09-28 NOTE — Telephone Encounter (Signed)
Encounter opened in error

## 2022-10-02 ENCOUNTER — Other Ambulatory Visit: Payer: Self-pay | Admitting: Radiology

## 2022-10-02 DIAGNOSIS — N958 Other specified menopausal and perimenopausal disorders: Secondary | ICD-10-CM

## 2022-10-02 MED ORDER — ESTRADIOL 0.075 MG/24HR TD PTTW: 1.0000 | MEDICATED_PATCH | TRANSDERMAL | 0 refills | Status: DC

## 2022-10-02 NOTE — Telephone Encounter (Signed)
Ok to increase to 0.75mg  twice a week 90 day supply

## 2022-10-02 NOTE — Telephone Encounter (Signed)
Medication refill request: estradiol tablet Last AEX:  07-18-22 Next AEX: not scheduled Last MMG (if hormonal medication request): 01-29-22 bilateral & left breast u/s 2/24 neg Refill authorized: please approve if appropriate

## 2022-10-02 NOTE — Telephone Encounter (Signed)
Rx pend.  Would you like pt to have a 56mo f/u visit for recheck?

## 2022-10-03 ENCOUNTER — Other Ambulatory Visit: Payer: Self-pay | Admitting: Nurse Practitioner

## 2022-10-03 DIAGNOSIS — N760 Acute vaginitis: Secondary | ICD-10-CM

## 2022-10-03 MED ORDER — FLUCONAZOLE 150 MG PO TABS: 150.0000 mg | ORAL_TABLET | ORAL | 0 refills | Status: DC

## 2022-10-31 ENCOUNTER — Other Ambulatory Visit: Payer: Self-pay | Admitting: Obstetrics and Gynecology

## 2022-10-31 DIAGNOSIS — Z8619 Personal history of other infectious and parasitic diseases: Secondary | ICD-10-CM

## 2022-10-31 NOTE — Telephone Encounter (Signed)
Med refill request: valacyclovir 500mg  Last AEX: 07/18/22 Last MMG (if hormonal med) Refill authorized: Please Advise?

## 2022-11-22 ENCOUNTER — Ambulatory Visit: Payer: BC Managed Care – PPO | Admitting: Radiology

## 2022-11-22 VITALS — BP 114/76 | Wt 144.0 lb

## 2022-11-22 DIAGNOSIS — N951 Menopausal and female climacteric states: Secondary | ICD-10-CM | POA: Diagnosis not present

## 2022-11-22 MED ORDER — ESTRADIOL 0.075 MG/24HR TD PTTW: 1.0000 | MEDICATED_PATCH | TRANSDERMAL | 3 refills | Status: DC

## 2022-11-22 NOTE — Progress Notes (Signed)
   Jillian Francis Jul 25, 1970 [id number]   History:  53 y.o. G1P1 S/p hyst. Doing well on patches since increase of dose.   Gynecologic History Hysterectomy Sexually active: yes  Health Maintenance Last Pap: 06/26/21. Results were: normal Last mammogram: 02/01/22. Results were: normal Last colonoscopy: 2021. Results were: normal Last Dexa: current  Past medical history, past surgical history, family history and social history were all reviewed and documented in the EPIC chart.  ROS:  A ROS was performed and pertinent positives and negatives are included.  Exam:  Vitals:   07/18/22 0828  BP: 116/72  Weight: 141 lb (64 kg)  Height: 5' 7.5" (1.715 m)   Body mass index is 21.76 kg/m.  Physical Exam Vitals and nursing note reviewed.  Constitutional:      Appearance: She is normal weight.  Pulmonary:     Effort: Pulmonary effort is normal.  Neurological:     Mental Status: She is alert.  Psychiatric:        Mood and Affect: Mood normal.        Thought Content: Thought content normal.        Judgment: Judgment normal.      Assessment/Plan:   1. Vasomotor symptoms due to menopause  - estradiol (VIVELLE-DOT) 0.075 MG/24HR; Place 1 patch onto the skin 2 (two) times a week.  Dispense: 24 patch; Refill: 3  [facility] at patient request  Tanda Rockers WHNP-BC 10:31 AM 07/18/2022

## 2022-12-25 ENCOUNTER — Other Ambulatory Visit: Payer: Self-pay | Admitting: Nurse Practitioner

## 2022-12-25 DIAGNOSIS — N958 Other specified menopausal and perimenopausal disorders: Secondary | ICD-10-CM

## 2022-12-25 NOTE — Telephone Encounter (Signed)
Medication refill request: estradiol vaginal tablet Last AEX:  07-18-22 Next AEX: not scheduled Last MMG (if hormonal medication request): 01-29-22 bilateral & left breast u/s 08-01-22 negative Refill authorized: please approve if appropriate

## 2023-03-11 NOTE — Telephone Encounter (Signed)
Spoke with patient. Patient reports discomfort with intercourse on 03/10/23. White vaginal d/c and itching. Has tried 1 day of monistat, symptoms improved some. Unsure if odor present.   OV scheduled for 10/8 at 0844 with JC.   Routing to provider for final review. Patient is agreeable to disposition. Will close encounter.

## 2023-03-12 ENCOUNTER — Ambulatory Visit: Payer: BC Managed Care – PPO | Admitting: Radiology

## 2023-03-12 VITALS — BP 110/62

## 2023-03-12 DIAGNOSIS — Z91013 Allergy to seafood: Secondary | ICD-10-CM | POA: Diagnosis not present

## 2023-03-12 DIAGNOSIS — B3731 Acute candidiasis of vulva and vagina: Secondary | ICD-10-CM | POA: Diagnosis not present

## 2023-03-12 DIAGNOSIS — R591 Generalized enlarged lymph nodes: Secondary | ICD-10-CM | POA: Diagnosis not present

## 2023-03-12 DIAGNOSIS — N760 Acute vaginitis: Secondary | ICD-10-CM

## 2023-03-12 LAB — WET PREP FOR TRICH, YEAST, CLUE

## 2023-03-12 MED ORDER — FLUCONAZOLE 150 MG PO TABS
150.0000 mg | ORAL_TABLET | ORAL | 0 refills | Status: DC
Start: 2023-03-12 — End: 2023-03-20

## 2023-03-12 NOTE — Progress Notes (Signed)
Subjective: Jillian Francis is a 52 y.o. female who complains of vaginal itching and burning, slight vaginal discharge. Symptoms began after taking Zpack and Prednisone 02/19/23 for swollen lymph nodes in neck, sores in mouth, nauseated after eating. Symptoms no better. Also recently developed a shrimp allergy, had a rash on her face and mouth after eating x 2. Concerned about possible other allergies.     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   Physical Exam Vitals and nursing note reviewed. Exam conducted with a chaperone present.  Constitutional:      Appearance: Normal appearance. She is normal weight.  HENT:     Head: Normocephalic and atraumatic.  Neck:     Thyroid: No thyroid mass, thyromegaly or thyroid tenderness.      Comments: +lymphadenopathy right mastoid Cardiovascular:     Rate and Rhythm: Regular rhythm.     Heart sounds: Normal heart sounds.  Pulmonary:     Effort: Pulmonary effort is normal.     Breath sounds: Normal breath sounds.  Chest:  Breasts:    Breasts are symmetrical.     Right: Normal. No inverted nipple, mass, nipple discharge, skin change or tenderness.     Left: Normal. No inverted nipple, mass, nipple discharge, skin change or tenderness.  Abdominal:     General: Abdomen is flat. Bowel sounds are normal.     Palpations: Abdomen is soft.  Genitourinary:    General: Normal vulva.     Vagina: Vaginal discharge present. No bleeding or lesions.     Cervix: Normal. No discharge or lesion.     Uterus: Absent.       Adnexa: Right adnexa normal and left adnexa normal.       Right: No mass, tenderness or fullness.         Left: No mass, tenderness or fullness.    Lymphadenopathy:     Upper Body:     Right upper body: No axillary adenopathy.     Left upper body: No axillary adenopathy.  Skin:    General: Skin is warm and dry.  Neurological:     Mental Status: She is alert and oriented to person, place, and time.  Psychiatric:        Mood and Affect: Mood normal.        Thought Content: Thought content normal.        Judgment: Judgment normal.      Microscopic wet-mount exam shows hyphae.   Jillian Francis, CMA present for exam  Assessment:/Plan:   1. Vulvovaginitis - WET PREP FOR TRICH, YEAST, CLUE +yeast, rx sent for fluconazole 150mg  po x 3 doses over 9 days  2. 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 Safe sex encouraged. Avoid the use of soaps or perfumed products in the peri  area. Avoid tub baths and sitting in sweaty or wet clothing for prolonged periods of time.   Return if symptoms worsen or fail to improve.   Jillian Francis B, NP 9:20 AM

## 2023-03-20 ENCOUNTER — Encounter: Payer: Self-pay | Admitting: Internal Medicine

## 2023-03-20 ENCOUNTER — Other Ambulatory Visit: Payer: Self-pay

## 2023-03-20 ENCOUNTER — Ambulatory Visit: Payer: BC Managed Care – PPO | Admitting: Internal Medicine

## 2023-03-20 VITALS — BP 120/80 | HR 70 | Temp 98.1°F | Resp 16 | Ht 67.5 in | Wt 146.1 lb

## 2023-03-20 DIAGNOSIS — Z8709 Personal history of other diseases of the respiratory system: Secondary | ICD-10-CM | POA: Diagnosis not present

## 2023-03-20 DIAGNOSIS — L500 Allergic urticaria: Secondary | ICD-10-CM | POA: Diagnosis not present

## 2023-03-20 DIAGNOSIS — J3089 Other allergic rhinitis: Secondary | ICD-10-CM | POA: Diagnosis not present

## 2023-03-20 DIAGNOSIS — T7800XA Anaphylactic reaction due to unspecified food, initial encounter: Secondary | ICD-10-CM | POA: Diagnosis not present

## 2023-03-20 MED ORDER — AZELASTINE-FLUTICASONE 137-50 MCG/ACT NA SUSP: 1.0000 | Freq: Two times a day (BID) | NASAL | 5 refills | Status: DC

## 2023-03-20 MED ORDER — AZELASTINE-FLUTICASONE 137-50 MCG/ACT NA SUSP: 2.0000 | Freq: Every day | NASAL | 5 refills | Status: DC | PRN

## 2023-03-20 MED ORDER — EPINEPHRINE 0.3 MG/0.3ML IJ SOAJ: 0.3000 mg | INTRAMUSCULAR | 1 refills | Status: AC | PRN

## 2023-03-20 NOTE — Patient Instructions (Addendum)
New onset of symptoms including facial rash, itchy bumps in hair, throat itchiness, and voice changes following consumption of shrimp and lobster. Symptoms resolved within a few hours. No anaphylaxis or respiratory distress reported. -Order skin testing and blood work for shellfish and mollusks allergy. -Advise strict avoidance of shellfish until test results are available. - Epipen 0.3mg  autoinjection prescribed  - Allergy Action Plan provided   Seasonal Allergies Reports of runny, itchy nose and eyes, and swollen lymph nodes. Currently managed with Ayvakit, Zyrtec D, and Flonase as needed. 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 -No changes to current management. Monitor for recurrence of symptoms.  Drug Allergy (Morphine) Reports of hives following administration of morphine during C-section. -Advise avoidance of morphine and consider alternative pain management strategies if needed in the future.   Follow up: for allergy testing   Thank you so much for letting me partake in your care today.  Don't hesitate to reach out if you have any additional concerns!  Ferol Luz, MD  [facility] [place] [place]

## 2023-03-20 NOTE — Progress Notes (Signed)
NEW PATIENT Date of Service/Encounter:  03/20/23 Referring provider: Tanda Rockers, NP Primary care provider: Tanda Rockers, NP  Subjective:  Jillian Francis is a 52 y.o. female  presenting today for evaluation of food allergy History obtained from: chart review and patient.   Discussed the use of AI scribe software for clinical note transcription with the patient, who gave verbal consent to proceed.  History of Present Illness   The patient presents with a concern for a newly developed allergy to shellfish, specifically shrimp. She reports no prior issues with seafood or shellfish. The first reaction occurred two months ago, approximately 45 minutes after consuming fresh shrimp. Accompanying the shrimp were a sauce, salad, and pasta, all of which the patient had consumed previously without issue.  The patient describes the reaction as a red, splotchy rash and itchy bumps in the hair. However, this reaction was more generalized, and she also experienced nausea, an itchy throat, and discomfort in her lymph nodes. She did not take any medication for these symptoms, believing she was getting sick. The symptoms resolved after a few hours, and by the next morning, she felt fine, albeit with a slightly squeaky voice.  The patient consumed the leftover shrimp the following day and experienced the same symptoms. She took Advil Cold and Sinus medication, believing the symptoms to be allergy-related, but found no relief.   Two weeks prior to the consultation, the patient accidentally consumed a piece of lobster and experienced similar symptoms, including a loss of voice and facial rash. Since the initial shrimp reaction, she has avoided all shellfish, with the exception of this incident. She has continued to consume fin fish, such as tuna and salmon, without any adverse reactions.  The patient also reports a history of seasonal allergies, particularly in the spring and fall, characterized by  a runny, stuffy nose and itchy eyes.  Denies animal triggers.  No sinus surgeries.  No hyposmia or ageusia.  Takes Zyrtec-D, Advil Cold and Sinus or and Flonase for management of symptoms.    She has a known allergy to morphine, which causes hives, and a possible allergy to the powder on latex gloves, which causes a rash. She has a history of reactive airway disease following a severe flu infection but has not used an inhaler in over a year and a half.   Other allergy screening: Asthma:  RAD after influenza in [address],  Rhino conjunctivitis: yes Food allergy: yes Medication allergy: yes: morphine causes hives;  Hymenoptera allergy: no Urticaria: no Eczema:no History of recurrent infections suggestive of immunodeficency: no Vaccinations are up to date.   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 23 g 5   EPINEPHrine (AUVI-Q) 0.3 mg/0.3 mL IJ SOAJ injection Inject 0.3 mg into the muscle as needed for anaphylaxis. 1 each 1   estradiol (VIVELLE-DOT) 0.075 MG/24HR Place 1 patch onto the skin 2 (two) times  a week. 24 patch 3   Estradiol 10 MCG TABS vaginal tablet INSERT 1 TABLET VAGINALLY TWICE A WEEK 24 tablet 2   olopatadine (PATANOL) 0.1 % ophthalmic solution Place 2 drops into both eyes at bedtime.     Omeprazole-Sodium Bicarbonate (ZEGERID PO) Take by mouth.     Probiotic Product (PROBIOTIC PO) Take by mouth as needed.      traMADol (ULTRAM) 50 MG tablet Take by mouth every 6 (six)  hours as needed.     No current facility-administered medications for this visit.   Known Allergies:  Allergies  Allergen Reactions   Latex     Other reaction(s): Unknown   Shrimp (Diagnostic) Other (See Comments)    Throat swelling, facial redness   Wound Dressing Adhesive     Other reaction(s): Unknown   Morphine And Codeine Hives and Rash    Patient tolerates oxycodone   Past Surgical History: Past Surgical History:  Procedure Laterality Date   CERVICAL BIOPSY  W/ LOOP ELECTRODE EXCISION     CESAREAN SECTION  1997   COLPOSCOPY     ROBOTIC ASSISTED LAPAROSCOPIC HYSTERECTOMY AND SALPINGECTOMY  08/06/11   fibroids partial hysterectomy   Family History: Family History  Problem Relation Age of Onset   Hypertension Father    Cancer Maternal Grandmother        tumor  on the brain?    ROS:  All other systems negative except as noted per HPI.  Objective:  Blood pressure 120/80, pulse 70, temperature 98.1 F (36.7 C), temperature source Temporal, resp. rate 16, height 5' 7.5" (1.715 m), weight 146 lb 1.6 oz (66.3 kg), last menstrual period 08/06/2011, SpO2 96%. Body mass index is 22.54 kg/m. Physical Exam:  General Appearance:  Alert, cooperative, no distress, appears stated age  Head:  Normocephalic, without obvious abnormality, atraumatic  Eyes:  Conjunctiva clear, EOM's intact  Ears EACs normal bilaterally  Nose: Nares normal,  PINK mucosa , hypertrophic turbinates, no visible anterior polyps, and septum midline  Throat: Lips, tongue normal; teeth and gums normal, normal posterior oropharynx  Neck: Supple, symmetrical  Lungs:   clear to auscultation bilaterally, Respirations unlabored, no coughing  Heart:  regular rate and rhythm and no murmur, Appears well perfused  Extremities: No edema  Skin: Skin color, texture, turgor normal and no rashes or lesions on visualized portions of skin  Neurologic: No gross deficits   Diagnostics: None done   Labs:  Lab Orders          Allergen Profile, Shellfish       Assessment and Plan  New onset of symptoms including facial rash, itchy bumps in hair, throat itchiness, and voice changes following consumption of shrimp and lobster. Symptoms resolved within a few hours. No anaphylaxis or respiratory distress reported. -Order skin testing and blood work for shellfish and mollusks allergy. -Advise strict avoidance of shellfish until test results are available. - Epipen 0.3mg  autoinjection prescribed  - Allergy Action Plan provided   Seasonal Allergies Reports of runny, itchy nose and eyes, and swollen lymph nodes. Currently managed with Ayvakit, Zyrtec D, and Flonase as needed. - Will have her return for allergy testing (( Hold all antihistamines for 3 days prior)  - Continue daily antihistamine: Over-the-counter's includes Zyrtec, Claritin, Allegra -Start Dymista 1 spray per nostril twice daily (this replaces Flonase)  Post-Infectious Reactive Airway Disease History of wheezing and use of inhaler following influenza infection, but no recurrence of symptoms in the past year and a half. -No changes  to current management. Monitor for recurrence of symptoms.  Drug Allergy (Morphine) Reports of hives following administration of morphine during C-section. -Advise avoidance of morphine and consider alternative pain management strategies if needed in the future.   Follow up: for allergy testing    This note in its entirety was forwarded to the Provider who requested this consultation.  Other:  Reviewed EpiPen use  Thank you for your kind referral. I appreciate the opportunity to take part in Brennen's care. Please do not hesitate to contact me with questions.  Sincerely,  Thank you so much for letting me partake in your care today.  Don't hesitate to reach out if you have any additional concerns!  Ferol Luz, MD  [facility] [place], [place]

## 2023-03-22 ENCOUNTER — Telehealth: Payer: Self-pay

## 2023-03-22 ENCOUNTER — Other Ambulatory Visit (HOSPITAL_COMMUNITY): Payer: Self-pay

## 2023-03-22 LAB — ALLERGEN PROFILE, SHELLFISH
Clam IgE: <0.1 kU/L
F023-IgE Crab: <0.1 kU/L
F080-IgE Lobster: <0.1 kU/L
F290-IgE Oyster: <0.1 kU/L
Scallop IgE: <0.1 kU/L
Shrimp IgE: <0.1 kU/L

## 2023-03-22 MED ORDER — AZELASTINE-FLUTICASONE 137-50 MCG/ACT NA SUSP: 1.0000 | Freq: Two times a day (BID) | NASAL | 5 refills | Status: DC

## 2023-03-22 NOTE — Telephone Encounter (Signed)
Pharmacy notified.

## 2023-03-22 NOTE — Telephone Encounter (Signed)
Pharmacy Patient Advocate Encounter   Received notification from CoverMyMeds that prior authorization for Azelastine-Fluticasone 137-50MCG/ACT suspension is required/requested.   Insurance verification completed.   The patient is insured through [employer] .   Per test claim: APPROVED from 03-22-2023 to 03-20-2024  KEY [id number]

## 2023-03-27 ENCOUNTER — Ambulatory Visit: Payer: BC Managed Care – PPO | Admitting: Internal Medicine

## 2023-03-27 DIAGNOSIS — J3089 Other allergic rhinitis: Secondary | ICD-10-CM

## 2023-03-27 DIAGNOSIS — T7800XA Anaphylactic reaction due to unspecified food, initial encounter: Secondary | ICD-10-CM

## 2023-03-27 DIAGNOSIS — T7800XD Anaphylactic reaction due to unspecified food, subsequent encounter: Secondary | ICD-10-CM

## 2023-03-27 NOTE — Patient Instructions (Addendum)
New onset of symptoms including facial rash, itchy bumps in hair, throat itchiness, and voice changes following consumption of shrimp and lobster. Symptoms resolved within a few hours. No anaphylaxis or respiratory distress reported. - Allergy test positive oyster,scallop and salmon  -Advise strict avoidance of shellfish and mollusks - Fin fish are okay to eat ( I think salmon is a false positive)  - Epipen 0.3mg  autoinjection prescribed  - Allergy Action Plan provided   Seasonal Allergies Reports of runny, itchy nose and eyes, and swollen lymph nodes. Currently managed with Ayvakit, Zyrtec D, and Flonase as needed. 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 -No changes to current management. Monitor for recurrence of symptoms.  Drug Allergy (Morphine) Reports of hives following administration of morphine during C-section. -Advise avoidance of morphine and consider alternative pain management strategies if needed in the future.   Follow up: 6 months   Thank you so much for letting me partake in your care today.  Don't hesitate to reach out if you have any additional concerns!  DUST MITE AVOIDANCE MEASURES:  There are three main measures that need and can be taken to avoid house dust mites:  Reduce accumulation of dust in general -reduce furniture, clothing, carpeting, books, stuffed animals, especially in bedroom  Separate yourself from the dust -use pillow and mattress encasements (can be found at stores such as [employer] or online) -avoid direct exposure to air condition flow -use a HEPA filter device, especially in the bedroom; you can also  use a HEPA filter vacuum cleaner -wipe dust with a moist towel instead of a dry towel or broom when cleaning  Decrease mites and/or their secretions -wash clothing and linen and stuffed animals at highest temperature possible, at least every 2 weeks -stuffed animals can also be placed in a bag and put in a freezer overnight  Despite the above measures, it is impossible to eliminate dust mites or their allergen completely from your home.  With the above measures the burden of mites in your home can be diminished, with the goal of minimizing your allergic symptoms.  Success will be reached only when implementing and using all means together.  Reducing Pollen Exposure  The American Academy of Allergy, Asthma and Immunology suggests the following steps to reduce your exposure to pollen during allergy seasons.    Do not hang sheets or clothing out to dry; pollen may collect on these items. Do not mow lawns or spend time around freshly cut grass; mowing stirs up pollen. Keep windows closed at night.  Keep car windows closed while driving. Minimize morning activities outdoors, a time when pollen counts are usually at their highest. Stay indoors as much as possible when pollen counts or humidity is high and on windy days when pollen tends to remain in the air longer. Use air conditioning when possible.  Many air conditioners have filters that trap the pollen spores. Use a HEPA room air filter to remove pollen form the indoor air you breathe.  Control of Mold Allergen   Mold and fungi can grow on a variety of surfaces provided certain temperature and moisture conditions exist.  Outdoor molds grow on plants, decaying  vegetation and soil.  The major outdoor mold, Alternaria and Cladosporium, are found in very high numbers during hot and dry conditions.  Generally, a late Summer - Fall peak is seen for common outdoor fungal spores.  Rain will temporarily lower outdoor mold spore count, but counts rise  rapidly when the rainy period ends.  The most important indoor molds are Aspergillus and Penicillium.  Dark, humid and poorly ventilated basements are ideal sites for mold growth.  The next most common sites of mold growth are the bathroom and the kitchen.  Outdoor (Seasonal) Mold Control  Positive outdoor molds via skin testing: Bipolaris (Helminthsporium), Drechslera (Curvalaria), and Mucor  Use air conditioning and keep windows closed Avoid exposure to decaying vegetation. Avoid leaf raking. Avoid grain handling. Consider wearing a face mask if working in moldy areas.    Indoor (Perennial) Mold Control   Positive indoor molds via skin testing: Fusarium, Aureobasidium (Pullulara), and Rhizopus  Maintain humidity below 50%. Clean washable surfaces with 5% bleach solution. Remove sources e.g. contaminated carpets.  Ferol Luz, MD  [facility] [place], [place]

## 2023-03-27 NOTE — Progress Notes (Signed)
Date of Service/Encounter:  03/27/23  Allergy testing appointment   Initial visit on 03/20/23, seen for food allergy, allergic rhinitis .  Please see that note for additional details.  Today reports for allergy diagnostic testing:    DIAGNOSTICS:  Skin Testing: Environmental allergy panel and select foods. Adequate positive and negative controls Results discussed with patient/family.  Airborne Adult Perc - 03/27/23 1443     Time Antigen Placed 1443    Allergen Manufacturer Waynette Buttery    Location Back    Number of Test 55    1. Control-Buffer 50% Glycerol Negative    2. Control-Histamine 4+    3. Bahia 2+    4. French Southern Territories Negative    5. Johnson Negative    6. Kentucky Blue Negative    7. Meadow Fescue Negative    8. Perennial Rye Negative    9. Timothy Negative    10. Ragweed Mix Negative    11. Cocklebur Negative    12. Plantain,  English Negative    13. Baccharis Negative    14. Dog Fennel Negative    15. Russian Thistle Negative    16. Lamb's Quarters Negative    17. Sheep Sorrell Negative    18. Rough Pigweed Negative    19. Marsh Elder, Rough Negative    20. Mugwort, Common Negative    21. Box, Elder Negative    22. Cedar, red Negative    23. Sweet Gum Negative    24. Pecan Pollen Negative    25. Pine Mix Negative    26. Walnut, Black Pollen Negative    27. Red Mulberry Negative    28. Ash Mix Negative    29. Birch Mix Negative    30. Beech American Negative    31. Cottonwood, Guinea-Bissau Negative    32. Hickory, White Negative    33. Maple Mix Negative    34. Oak, Guinea-[place] Mix Negative    35. Sycamore Eastern Negative    36. Alternaria Alternata Negative    37. Cladosporium Herbarum Negative    38. Aspergillus Mix Negative    39. Penicillium Mix Negative    40. Bipolaris Sorokiniana (Helminthosporium) Negative    41. Drechslera Spicifera (Curvularia) Negative    42. Mucor Plumbeus Negative    43. Fusarium Moniliforme Negative    44. Aureobasidium Pullulans  (pullulara) Negative    45. Rhizopus Oryzae Negative    46. Botrytis Cinera Negative    47. Epicoccum Nigrum Negative    48. Phoma Betae Negative    49. Dust Mite Mix 3+    50. Cat Hair 10,000 BAU/ml Negative    51.  Dog Epithelia Negative    52. Mixed Feathers Negative    53. Horse Epithelia Negative    54. Cockroach, German Negative    55. Tobacco Leaf Negative             Intradermal - 03/27/23 1531     Time Antigen Placed 1531    Allergen Manufacturer Waynette Buttery    Location Arm    Number of Test 14    Control Negative    French Southern Territories Negative    Johnson 2+    7 Grass Negative    Ragweed Mix Negative    Weed Mix Negative    Tree Mix Negative    Mold 1 Negative    Mold 2 Negative    Mold 3 2+    Mold 4 2+    Cat Negative    Dog Negative  Cockroach Negative             Food Adult Perc - 03/27/23 1400     Time Antigen Placed 1443    Allergen Manufacturer Waynette Buttery    Location Back    Number of allergen test 10    18. Trout Negative    19. Tuna Negative    20. Salmon --   5*20   21. Flounder Negative    22. Codfish Negative    23. Shrimp Negative    24. Crab Negative    25. Lobster Negative    26. Oyster --   5*10   27. Scallops --   5*10            Allergy testing results were read and interpreted by myself, documented by clinical staff.  Patient provided with copy of allergy testing along with avoidance measures when indicated.   Ferol Luz, MD  [facility]

## 2023-05-06 ENCOUNTER — Ambulatory Visit: Payer: BC Managed Care – PPO | Admitting: Internal Medicine

## 2023-05-27 ENCOUNTER — Other Ambulatory Visit: Payer: Self-pay | Admitting: Orthopedic Surgery

## 2023-05-27 DIAGNOSIS — M4316 Spondylolisthesis, lumbar region: Secondary | ICD-10-CM

## 2023-05-27 DIAGNOSIS — M41126 Adolescent idiopathic scoliosis, lumbar region: Secondary | ICD-10-CM

## 2023-05-27 DIAGNOSIS — M47816 Spondylosis without myelopathy or radiculopathy, lumbar region: Secondary | ICD-10-CM

## 2023-06-17 ENCOUNTER — Encounter: Payer: Self-pay | Admitting: Orthopedic Surgery

## 2023-06-25 ENCOUNTER — Other Ambulatory Visit: Payer: BC Managed Care – PPO

## 2023-08-08 ENCOUNTER — Encounter: Payer: Self-pay | Admitting: Radiology

## 2023-08-22 ENCOUNTER — Encounter: Payer: Self-pay | Admitting: Radiology

## 2023-08-23 ENCOUNTER — Other Ambulatory Visit: Payer: Self-pay

## 2023-08-27 LAB — SURGICAL PATHOLOGY

## 2023-09-04 ENCOUNTER — Encounter: Payer: Self-pay | Admitting: Radiology

## 2023-09-06 ENCOUNTER — Ambulatory Visit: Admitting: Radiology

## 2023-09-06 ENCOUNTER — Other Ambulatory Visit: Payer: Self-pay | Admitting: Radiology

## 2023-09-06 ENCOUNTER — Telehealth: Payer: Self-pay

## 2023-09-06 DIAGNOSIS — R102 Pelvic and perineal pain: Secondary | ICD-10-CM

## 2023-09-06 NOTE — Telephone Encounter (Signed)
 Appt scheduled, encounter closed.

## 2023-09-06 NOTE — Telephone Encounter (Signed)
 Pt reports that for about several weeks now having pain/discomfort in LLQ (mainly) and RLQ areas w/ some shooting pain in backside/lower flank. More dull constantly but times of sharpness ("feeling as if ovarian cyst is bursting") that can last 3-4 hours and be around 8/10 on pain scale. Reported that yesterday, she sat down and experienced this sharpness and feels as if she has been putting it off for too long before inquiring about an evaluation. Stated has tried OTC meds to help including GI meds for BM's but no persistent relief. Latest BM was this AM.  Reported if sitting or driving for a long time, reports feeling as if there is something moving and painful.   Hyst   Known hx of fibroids but nothing of cysts per pt.   Appt scheduled for OV today @ 12pm but pt is inquiring about imaging if needed.  Please advise.

## 2023-09-06 NOTE — Telephone Encounter (Signed)
 We can put her in to the 8am slot for u/s Tuesday and I can see her after if she thinks she will be ok until then.

## 2023-09-06 NOTE — Telephone Encounter (Signed)
 Pt is agreeable to plan.   Msged appt desk to facilitate appts.

## 2023-09-10 ENCOUNTER — Ambulatory Visit (INDEPENDENT_AMBULATORY_CARE_PROVIDER_SITE_OTHER)

## 2023-09-10 ENCOUNTER — Encounter: Payer: Self-pay | Admitting: Radiology

## 2023-09-10 ENCOUNTER — Ambulatory Visit (INDEPENDENT_AMBULATORY_CARE_PROVIDER_SITE_OTHER): Admitting: Radiology

## 2023-09-10 VITALS — BP 112/70 | HR 68

## 2023-09-10 DIAGNOSIS — R102 Pelvic and perineal pain: Secondary | ICD-10-CM

## 2023-09-10 DIAGNOSIS — R1032 Left lower quadrant pain: Secondary | ICD-10-CM | POA: Diagnosis not present

## 2023-09-10 NOTE — Progress Notes (Signed)
   Jillian Francis [birth year] [id number]   History:  53 y.o. G1P1 presents with c/o LLQ pain x months, worse last week, described the pain as severe. S/p hyst 2013. Last visit with Dr Loreta Ave 2021  Gynecologic History Hysterectomy: 2013  Sexually active: yes  Health Maintenance Last mammogram: 08/23/23. Results were: abnormal Last colonoscopy: 2021     Past medical history, past surgical history, family history and social history were all reviewed and documented in the EPIC chart.  ROS:  A ROS was performed and pertinent positives and negatives are included.  Exam:  Vitals:   09/10/23 0822  BP: 112/70  Pulse: 68  SpO2: 98%   There is no height or weight on file to calculate BMI.  Narrative & Impression Indication:LLQ pain   Vaginal ultrasound   Cervix and uterus surgically absent   Vaginal cuff WNL   Both ovaries normal size with normal follicle pattern and perfusion   No adnexal masses   No free fluid   Impression:Normal gyn u/s       Exam Ended: 09/10/23 08:14 Last Resulted: 09/10/23 08:34    Assessment/Plan:   1. LLQ pain (Primary) Reassured normal gyn u/s Likely GI related, follow up with Dr Loreta Ave - Ambulatory referral to Gastroenterology    Tanda Rockers WHNP-BC 8:35 AM 09/10/2023

## 2023-09-13 ENCOUNTER — Other Ambulatory Visit: Payer: Self-pay | Admitting: General Surgery

## 2023-09-13 DIAGNOSIS — D241 Benign neoplasm of right breast: Secondary | ICD-10-CM

## 2023-09-25 ENCOUNTER — Ambulatory Visit: Payer: BC Managed Care – PPO | Admitting: Internal Medicine

## 2023-09-25 ENCOUNTER — Other Ambulatory Visit: Payer: Self-pay

## 2023-09-25 VITALS — BP 118/70 | HR 77 | Temp 97.9°F | Resp 18 | Wt 149.6 lb

## 2023-09-25 DIAGNOSIS — Z8709 Personal history of other diseases of the respiratory system: Secondary | ICD-10-CM | POA: Diagnosis not present

## 2023-09-25 DIAGNOSIS — L500 Allergic urticaria: Secondary | ICD-10-CM

## 2023-09-25 DIAGNOSIS — T7800XD Anaphylactic reaction due to unspecified food, subsequent encounter: Secondary | ICD-10-CM

## 2023-09-25 DIAGNOSIS — J3089 Other allergic rhinitis: Secondary | ICD-10-CM | POA: Diagnosis not present

## 2023-09-25 DIAGNOSIS — T7800XA Anaphylactic reaction due to unspecified food, initial encounter: Secondary | ICD-10-CM

## 2023-09-25 NOTE — Progress Notes (Signed)
 FOLLOW UP Date of Service/Encounter:  09/25/23  Subjective:  Jillian Francis (DOB: [birth year]) is a 53 y.o. female who [facility] in re-evaluation of the following: shellfish allergy , rhinitis, drug allergy  History obtained from: chart review and patient.  For Review, LV was on 03/28/23  with Dr. Jolayne Natter seen for  skin testing  . See below for summary of history and diagnostics.   ----------------------------------------------------- Pertinent History/Diagnostics:  Post-Infectious Reactive Airway Disease : History of wheezing and use of inhaler following influenza infection, but no recurrence of symptoms in the past year and a half. Allergic Rhinitis:  Reports of runny, itchy nose and eyes, and swollen lymph nodes - SPT environmental panel (03/27/23): dust mite Food Allergy :  New onset of symptoms including facial rash, itchy bumps in hair, throat itchiness, and voice changes following consumption of shrimp and lobster. Symptoms resolved within a few hours. No anaphylaxis or respiratory distress reported. - SPT select foods (03/27/23): oyster,scallop and salmon  Drug allergy  : Morphine: hives following administration of morphine during C-section.  --------------------------------------------------- Today presents for follow-up. Discussed the use of AI scribe software for clinical note transcription with the patient, who gave verbal consent to proceed.  History of Present Illness Jillian Francis is a 53 year old female who presents with a recent allergic reaction.  She experienced an allergic reaction at a football game after consuming food that may have been cross-contaminated with shellfish. Symptoms included itchy hives and loss of voice, described as 'barely gave a little squeak'. Initially, she attributed her symptoms to sulfites in red wine, which she was also consuming, but her symptoms worsened over two hours, prompting her to leave  the event. She did not use her EpiPen  during the reaction due to its expense and her reluctance to self-administer.  She took a benadryl when she got home and symptoms improved over 2 hours.    She takes Allegra, an antihistamine, three to four times a week, primarily to manage reactions to sulfites when she drinks wine, but did not take it on the day of the event. No wheezing, shortness of breath, colds, or flu symptoms recently. This spring, she has not had a sinus infection, which she attributes to regular use of Dymista .   She is scheduled for the removal of two breast masses on Oct 23, 2023. She has not been prescribed any medication for this condition yet. She experiences nausea with certain anesthetics and plans to continue using antihistamines perioperatively to prevent hives.     All medications reviewed by clinical staff and updated in chart. No new pertinent medical or surgical history except as noted in HPI.  ROS: All others negative except as noted per HPI.   Objective:  BP 118/70   Pulse 77   Temp 97.9 F (36.6 C) (Temporal)   Resp 18   Wt 149 lb 9.6 oz (67.9 kg)   LMP 08/06/2011 (Exact Date)   SpO2 99%   BMI 23.08 kg/m  Body mass index is 23.08 kg/m. Physical Exam: General Appearance:  Alert, cooperative, no distress, appears stated age  Head:  Normocephalic, without obvious abnormality, atraumatic  Eyes:  Conjunctiva clear, EOM's intact  Ears EACs normal bilaterally  Nose: Nares normal, normal mucosa, no visible anterior polyps, and septum midline  Throat: Lips, tongue normal; teeth and gums normal, normal posterior oropharynx  Neck: Supple, symmetrical  Lungs:   clear to auscultation bilaterally, Respirations unlabored, no coughing  Heart:  regular rate and  rhythm and no murmur, Appears well perfused  Extremities: No edema  Skin: Skin color, texture, turgor normal and no rashes or lesions on visualized portions of skin  Neurologic: No gross deficits   Labs:   Lab Orders  No laboratory test(s) ordered today   Assessment/Plan     Shellfish Allergy  - Allergy  test positive oyster,scallop and salmon  - Advise strict avoidance of shellfish and mollusks - Epipen  0.3mg  autoinjection prescribed  - Allergy  Action Plan provided   Seasonal Allergies - Allergy  test positive to dust mite  - Continue avoidance measures  - Continue daily antihistamine: Over-the-counter's includes Zyrtec, Claritin, Allegra - Continue Dymista  1 spray per nostril twice daily    Morphine Allergy   - Recommend continuing daily anithistamine  perioperatively around breast lesion removal.    Follow up: 12 months   Thank you so much for letting me partake in your care today.  Don't hesitate to reach out if you have any additional concerns!     Other:     Thank you so much for letting me partake in your care today.  Don't hesitate to reach out if you have any additional concerns!  Orelia Binet, MD  [facility] [place], [place]

## 2023-09-25 NOTE — Patient Instructions (Addendum)
 Shellfish Allergy  - Allergy  test positive oyster,scallop and salmon  - Advise strict avoidance of shellfish and mollusks - tolerates all forms of fin fish  - Epipen  0.3mg  autoinjection prescribed  - Follow Allergy  Action Plan for any reactions   Seasonal Allergies - Allergy  test positive to dust mite  - Continue avoidance measures  - Continue daily antihistamine: options include Zyrtec, Claritin, Allegra - Continue Dymista  1 spray per nostril twice daily    Morphine Allergy   - Recommend continuing daily antihistamine  perioperatively around breast lesion removal.    Follow up: 12 months   Thank you so much for letting me partake in your care today.  Don't hesitate to reach out if you have any additional concerns!

## 2023-10-17 ENCOUNTER — Encounter (HOSPITAL_BASED_OUTPATIENT_CLINIC_OR_DEPARTMENT_OTHER): Payer: Self-pay | Admitting: General Surgery

## 2023-10-17 ENCOUNTER — Other Ambulatory Visit: Payer: Self-pay

## 2023-10-17 NOTE — Progress Notes (Signed)
   10/17/23 0935  PAT Phone Screen  Is the patient taking a GLP-1 receptor agonist? No  Do You Have Diabetes? No  Do You Have Hypertension? No  Have You Ever Been to the ER for Asthma? No  Have You Taken Oral Steroids in the Past 3 Months? No  Do you Take Phenteramine or any Other Diet Drugs? No  Recent  Lab Work, EKG, CXR? No  Do you have a history of heart problems? No  Any Recent Hospitalizations? No  Height 5' 7.5" (1.715 m)  Weight 67.9 kg  Pat Appointment Scheduled (S)  Yes ([facility])

## 2023-10-21 NOTE — H&P (Signed)
 REFERRING PHYSICIAN:  Eulas Hick     PROVIDER:  Eppie Hasting, MD   Care Team: Patient Care Team: Chrzanowski, Jami as PCP - General Eppie Hasting, MD as Consulting Provider (Surgical Oncology)    MRN: [id number] DOB: 1970-12-05 DATE OF ENCOUNTER: 09/13/2023   Subjective    Chief Complaint: Right Breast Papilloma       History of Present Illness: Jillian Francis is a 53 y.o. female who is seen today as an office consultation at the request of Dr. Marta Skillern for evaluation of Right Breast Papilloma   Patient presents with new diagnosis of right breast papilloma with atypia March 2025.  I am seeing her in April.  The patient was undergoing routine screening mammography.  She had screening detected asymmetry.  Diagnostic imaging was performed.  The ultrasound and mammogram showed a 5 mm mass on the mammogram and ultrasound demonstrated a complicated cyst at 9:00 as well as a 5 mm mass at 8:00 that appeared to be a complicated cyst.  Biopsies of both of these were performed.  The 1 at 9:00 was an intraductal papilloma without atypia, and the 8:00 mass was an intraductal papilloma with atypia.  The patient is referred to discuss excision.   The patient does not know of any family history of breast cancer, but does know that her biological father had some sort of tumor in his abdomen that led to his death.   Of note, the patient does have a history of a colon polyp and is due to get a follow-up colonoscopy.  She has been having some change in her bowel habits and wants to get this done expeditiously as well.   Work -patient works in Engineer, [employer].   Diagnostic mammogram:08/21/2023      Dx ultrasound [facility] 08/21/23    Pathology core needle biopsy: 08/23/2023 1. Breast, right, needle core biopsy, 9:00, 7 cmfn :       -  INTRADUCTAL PAPILLOMA       -  NO ATYPIA OR MALIGNANCY IDENTIFIED       -  SEE COMMENT        2. Breast, right, needle core biopsy, 8:00, 6 cmfn :       -   INTRADUCTAL PAPILLOMA WITH FOCAL ATYPIA       -  SEE COMMENT        Review of Systems: A complete review of systems was obtained from the patient.  I have reviewed this information and discussed as appropriate with the patient.  See HPI as well for other ROS. ROS -positive for tinnitus, leg swelling, abdominal discomfort, constipation, anxious, depression, and easy bruising or bleeding.       Medical History: Past Medical History      Past Medical History:  Diagnosis Date   Anemia     Anxiety     Arthritis          Problem List     Patient Active Problem List  Diagnosis   Intraductal papilloma of right breast        Past Surgical History       Past Surgical History:  Procedure Laterality Date   CESAREAN SECTION   1998   Robotic assisted total laparoscopic hysterectomy and bilateral salpingectomy   08/06/2011    Dr. Tita Form. Rom[place]        Allergies       Allergies  Allergen Reactions   Latex Unknown      Other reaction(s): Unknown  Shellfish Containing Products Hives, Itching, Nausea, Other (See Comments), Rash and Swelling      Throat swelling, facial redness   Morphine Hives and Rash      Patient tolerates oxycodone         Medications Ordered Prior to Encounter        Current Outpatient Medications on File Prior to Visit  Medication Sig Dispense Refill   estradiol  (DOTTI ) patch 0.075 mg/24hr Place 1 patch onto the skin       estradioL  (VAGIFEM ) 10 mcg vaginal tablet INSERT 1 TABLET VAGINALLY TWICE A WEEK       finasteride (PROPECIA) 1 mg tablet Take 1 mg by mouth once daily       fluconazole  (DIFLUCAN ) 150 MG tablet TAKE 1 TABLET BY MOUTH EVERY 3 DAYS.        No current facility-administered medications on file prior to visit.        Family History       Family History  Problem Relation Age of Onset   Deep vein thrombosis (DVT or abnormal blood clot formation) Mother     Diabetes Mother     Hyperlipidemia (Elevated cholesterol) Father     High  blood pressure (Hypertension) Father          Tobacco Use History  Social History       Tobacco Use  Smoking Status Never  Smokeless Tobacco Never        Social History  Social History         Socioeconomic History   Marital status: Unknown  Tobacco Use   Smoking status: Never   Smokeless tobacco: Never  Vaping Use   Vaping status: Former  Substance and Sexual Activity   Alcohol use: Yes      Alcohol/week: 2.0 - 3.0 standard drinks of alcohol      Types: 2 - 3 Standard drinks or equivalent per week   Drug use: Never    Social Drivers of Health        Housing Stability: Unknown (09/13/2023)    Housing Stability Vital Sign     Homeless in the Last Year: No        Objective:         Vitals:    09/13/23 0924  BP: 114/80  Pulse: 87  Temp: 36.4 C (97.6 F)  SpO2: 96%  Weight: 67.7 kg (149 lb 3.2 oz)  Height: 174 cm (5' 8.5")  PainSc: 0-No pain    Body mass index is 22.36 kg/m.   Vitals:    Gen:  No acute distress.  Well nourished and well groomed.   Neurological: Alert and oriented to person, place, and time. Coordination normal.  Head: Normocephalic and atraumatic.  Eyes: Conjunctivae are normal. Pupils are equal, round, and reactive to light. No scleral icterus.  Neck: Normal range of motion. Neck supple. No tracheal deviation or thyromegaly present.  Cardiovascular: Normal rate, regular rhythm, normal heart sounds and intact distal pulses.  Exam reveals no gallop and no friction rub.  No murmur heard. Breast: Breasts are very dense bilaterally.  There is some bruising inferior laterally on the right breast.  There is no nipple retraction or nipple discharge.  No breast contour abnormalities.  No palpable masses are present, and lateral lymphadenopathy is present. Respiratory: Effort normal.  No respiratory distress. No chest wall tenderness. Breath sounds normal.  No wheezes, rales or rhonchi.  GI: Soft. Bowel sounds are normal. The abdomen is soft and  nontender.  There is no rebound and no guarding.  Musculoskeletal: Normal range of motion. Extremities are nontender.  Lymphadenopathy: No cervical, preauricular, postauricular or axillary adenopathy is present Skin: Skin is warm and dry. No rash noted. No diaphoresis. No erythema. No pallor. No clubbing, cyanosis, or edema.   Psychiatric: Normal mood and affect. Behavior is normal. Judgment and thought content normal.      Labs None available   Assessment and Plan:        ICD-10-CM    1. Intraductal papilloma of right breast  D24.1         The patient has 2 right breast papillomas.  The one at 8:00 has atypia.  I recommend excision of this.  The other papilloma could potentially be followed, but given that this is about 1-1/2 to 2 cm away, it makes sense to excise them both at the time of surgery.  This will minimize the amount of follow-up imaging that she will have to have.   I discussed the risk of cancer especially in the papilloma with atypia.  I reviewed that if we do find cancer that this would or require additional treatment and perhaps additional surgery.  I discussed the risks of surgery including bleeding, infection, breast pain, heart or lung complications, blood clot, and potential risk of seroma requiring aspiration.   The patient would like to do this as soon as possible.  She does have some work travel plans that she would like to be recovered adequately for because she has to drive 5 hours.

## 2023-10-22 MED ORDER — CHLORHEXIDINE GLUCONATE CLOTH 2 % EX PADS: 6.0000 | MEDICATED_PAD | Freq: Once | CUTANEOUS | Status: DC

## 2023-10-22 NOTE — Progress Notes (Signed)

## 2023-10-23 ENCOUNTER — Encounter (HOSPITAL_BASED_OUTPATIENT_CLINIC_OR_DEPARTMENT_OTHER): Payer: Self-pay | Admitting: General Surgery

## 2023-10-23 ENCOUNTER — Other Ambulatory Visit: Payer: Self-pay

## 2023-10-23 ENCOUNTER — Encounter (HOSPITAL_BASED_OUTPATIENT_CLINIC_OR_DEPARTMENT_OTHER)
Admission: RE | Disposition: A | Discharge status: Home / Self Care | Payer: Self-pay | Source: Home / Self Care | Attending: General Surgery

## 2023-10-23 ENCOUNTER — Ambulatory Visit (HOSPITAL_BASED_OUTPATIENT_CLINIC_OR_DEPARTMENT_OTHER): Admitting: Anesthesiology

## 2023-10-23 ENCOUNTER — Ambulatory Visit (HOSPITAL_BASED_OUTPATIENT_CLINIC_OR_DEPARTMENT_OTHER)
Admission: RE | Admit: 2023-10-23 | Discharge: 2023-10-23 | Disposition: A | Discharge status: Home / Self Care | Attending: General Surgery | Admitting: General Surgery

## 2023-10-23 DIAGNOSIS — N6489 Other specified disorders of breast: Secondary | ICD-10-CM | POA: Insufficient documentation

## 2023-10-23 DIAGNOSIS — K219 Gastro-esophageal reflux disease without esophagitis: Secondary | ICD-10-CM | POA: Diagnosis not present

## 2023-10-23 DIAGNOSIS — Z79899 Other long term (current) drug therapy: Secondary | ICD-10-CM | POA: Diagnosis not present

## 2023-10-23 DIAGNOSIS — F909 Attention-deficit hyperactivity disorder, unspecified type: Secondary | ICD-10-CM | POA: Diagnosis not present

## 2023-10-23 DIAGNOSIS — D241 Benign neoplasm of right breast: Secondary | ICD-10-CM | POA: Insufficient documentation

## 2023-10-23 HISTORY — PX: RADIOACTIVE SEED GUIDED EXCISIONAL BREAST BIOPSY: SHX6490

## 2023-10-23 HISTORY — PX: BREAST SURGERY: SHX581

## 2023-10-23 SURGERY — BIOPSY, BREAST WITH MAGNETIC MARKER LOCALIZATION
Anesthesia: General | Site: Breast | Laterality: Right

## 2023-10-23 MED ORDER — SCOPOLAMINE 1 MG/3DAYS TD PT72
1.0000 | MEDICATED_PATCH | Freq: Once | TRANSDERMAL | Status: DC
  Administered 2023-10-23: 1.5 mg via TRANSDERMAL

## 2023-10-23 MED ORDER — MIDAZOLAM HCL 2 MG/2ML IJ SOLN
INTRAMUSCULAR | Status: AC
  Filled 2023-10-23: qty 2

## 2023-10-23 MED ORDER — MIDAZOLAM HCL 2 MG/2ML IJ SOLN
INTRAMUSCULAR | Status: DC | PRN
  Administered 2023-10-23 (×2): 1 mg via INTRAVENOUS

## 2023-10-23 MED ORDER — FENTANYL CITRATE (PF) 100 MCG/2ML IJ SOLN
INTRAMUSCULAR | Status: DC | PRN
  Administered 2023-10-23 (×2): 50 ug via INTRAVENOUS

## 2023-10-23 MED ORDER — CEFAZOLIN SODIUM-DEXTROSE 2-3 GM-%(50ML) IV SOLR
INTRAVENOUS | Status: DC | PRN
  Administered 2023-10-23: 2 g via INTRAVENOUS

## 2023-10-23 MED ORDER — DEXMEDETOMIDINE HCL IN NACL 80 MCG/20ML IV SOLN
INTRAVENOUS | Status: AC
  Filled 2023-10-23: qty 20

## 2023-10-23 MED ORDER — PROPOFOL 500 MG/50ML IV EMUL
INTRAVENOUS | Status: AC
  Filled 2023-10-23: qty 100

## 2023-10-23 MED ORDER — ONDANSETRON HCL 4 MG/2ML IJ SOLN: 4.0000 mg | Freq: Once | INTRAMUSCULAR | Status: DC | PRN

## 2023-10-23 MED ORDER — ACETAMINOPHEN 500 MG PO TABS
ORAL_TABLET | ORAL | Status: AC
  Filled 2023-10-23: qty 2

## 2023-10-23 MED ORDER — ACETAMINOPHEN 500 MG PO TABS
1000.0000 mg | ORAL_TABLET | ORAL | Status: AC
  Administered 2023-10-23: 1000 mg via ORAL

## 2023-10-23 MED ORDER — CEFAZOLIN SODIUM-DEXTROSE 2-4 GM/100ML-% IV SOLN: 2.0000 g | INTRAVENOUS | Status: DC

## 2023-10-23 MED ORDER — OXYCODONE HCL 5 MG PO TABS
ORAL_TABLET | ORAL | Status: AC
  Filled 2023-10-23: qty 1

## 2023-10-23 MED ORDER — DEXAMETHASONE SODIUM PHOSPHATE 10 MG/ML IJ SOLN
INTRAMUSCULAR | Status: DC | PRN
  Administered 2023-10-23: 10 mg via INTRAVENOUS

## 2023-10-23 MED ORDER — OXYCODONE HCL 5 MG PO TABS: 5.0000 mg | ORAL_TABLET | Freq: Four times a day (QID) | ORAL | 0 refills | Status: DC | PRN

## 2023-10-23 MED ORDER — LIDOCAINE HCL (PF) 1 % IJ SOLN
INTRAMUSCULAR | Status: DC | PRN
  Administered 2023-10-23: 15 mL

## 2023-10-23 MED ORDER — OXYCODONE HCL 5 MG PO TABS
5.0000 mg | ORAL_TABLET | Freq: Once | ORAL | Status: AC
  Administered 2023-10-23: 5 mg via ORAL

## 2023-10-23 MED ORDER — ONDANSETRON HCL 4 MG/2ML IJ SOLN
INTRAMUSCULAR | Status: DC | PRN
  Administered 2023-10-23: 4 mg via INTRAVENOUS

## 2023-10-23 MED ORDER — PROPOFOL 10 MG/ML IV BOLUS
INTRAVENOUS | Status: DC | PRN
  Administered 2023-10-23: 170 ug via INTRAVENOUS

## 2023-10-23 MED ORDER — LACTATED RINGERS IV SOLN: INTRAVENOUS | Status: DC

## 2023-10-23 MED ORDER — FENTANYL CITRATE (PF) 100 MCG/2ML IJ SOLN
INTRAMUSCULAR | Status: AC
  Filled 2023-10-23: qty 2

## 2023-10-23 MED ORDER — PROPOFOL 500 MG/50ML IV EMUL
INTRAVENOUS | Status: DC | PRN
Start: 2023-10-23 — End: 2023-10-23
  Administered 2023-10-23: 100 ug/kg/min via INTRAVENOUS
  Administered 2023-10-23: 125 ug/kg/min via INTRAVENOUS

## 2023-10-23 MED ORDER — LIDOCAINE 2% (20 MG/ML) 5 ML SYRINGE
INTRAMUSCULAR | Status: DC | PRN
Start: 2023-10-23 — End: 2023-10-23
  Administered 2023-10-23: 80 mg via INTRAVENOUS

## 2023-10-23 MED ORDER — SCOPOLAMINE 1 MG/3DAYS TD PT72
MEDICATED_PATCH | TRANSDERMAL | Status: AC
  Filled 2023-10-23: qty 1

## 2023-10-23 MED ORDER — BUPIVACAINE-EPINEPHRINE 0.5% -1:200000 IJ SOLN
INTRAMUSCULAR | Status: DC | PRN
Start: 2023-10-23 — End: 2023-10-23
  Administered 2023-10-23: 15 mL

## 2023-10-23 MED ORDER — AMISULPRIDE (ANTIEMETIC) 5 MG/2ML IV SOLN: 10.0000 mg | Freq: Once | INTRAVENOUS | Status: DC | PRN

## 2023-10-23 MED ORDER — CEFAZOLIN SODIUM-DEXTROSE 2-4 GM/100ML-% IV SOLN
INTRAVENOUS | Status: AC
  Filled 2023-10-23: qty 100

## 2023-10-23 MED ORDER — 0.9 % SODIUM CHLORIDE (POUR BTL) OPTIME
TOPICAL | Status: DC | PRN
Start: 2023-10-23 — End: 2023-10-23
  Administered 2023-10-23: 1000 mL

## 2023-10-23 MED ORDER — FENTANYL CITRATE (PF) 100 MCG/2ML IJ SOLN
25.0000 ug | INTRAMUSCULAR | Status: DC | PRN
  Administered 2023-10-23: 50 ug via INTRAVENOUS

## 2023-10-23 SURGICAL SUPPLY — 47 items
BINDER BREAST LRG (GAUZE/BANDAGES/DRESSINGS) ×1 IMPLANT
BLADE SURG 10 STRL SS (BLADE) ×2 IMPLANT
BLADE SURG 15 STRL LF DISP TIS (BLADE) ×1 IMPLANT
CANISTER SUC SOCK COL 7IN (MISCELLANEOUS) IMPLANT
CANISTER SUCT 1200ML W/VALVE (MISCELLANEOUS) ×2 IMPLANT
CHLORAPREP W/TINT 26 (MISCELLANEOUS) ×2 IMPLANT
CLIP TI LARGE 6 (CLIP) ×2 IMPLANT
CLSR STERI-STRIP ANTIMIC 1/2X4 (GAUZE/BANDAGES/DRESSINGS) ×1 IMPLANT
COVER BACK TABLE 60X90IN (DRAPES) ×2 IMPLANT
COVER MAYO STAND STRL (DRAPES) ×2 IMPLANT
COVER PROBE CYLINDRICAL 5X96 (MISCELLANEOUS) ×2 IMPLANT
DERMABOND ADVANCED .7 DNX12 (GAUZE/BANDAGES/DRESSINGS) ×2 IMPLANT
DRAPE LAPAROSCOPIC ABDOMINAL (DRAPES) ×2 IMPLANT
DRAPE UTILITY XL STRL (DRAPES) ×2 IMPLANT
ELECT COATED BLADE 2.86 ST (ELECTRODE) ×2 IMPLANT
ELECTRODE REM PT RTRN 9FT ADLT (ELECTROSURGICAL) ×2 IMPLANT
GAUZE SPONGE 4X4 12PLY STRL (GAUZE/BANDAGES/DRESSINGS) ×1 IMPLANT
GAUZE SPONGE 4X4 12PLY STRL LF (GAUZE/BANDAGES/DRESSINGS) ×2 IMPLANT
GLOVE BIO SURGEON STRL SZ 6 (GLOVE) ×2 IMPLANT
GLOVE BIOGEL PI IND STRL 6.5 (GLOVE) ×2 IMPLANT
GLOVE BIOGEL PI IND STRL 7.0 (GLOVE) ×1 IMPLANT
GLOVE BIOGEL PI IND STRL 7.5 (GLOVE) ×1 IMPLANT
GOWN STRL REUS W/ TWL LRG LVL3 (GOWN DISPOSABLE) ×2 IMPLANT
GOWN STRL REUS W/ TWL XL LVL3 (GOWN DISPOSABLE) ×2 IMPLANT
KIT MARKER MARGIN INK (KITS) ×2 IMPLANT
LIGHT WAVEGUIDE WIDE FLAT (MISCELLANEOUS) ×1 IMPLANT
NDL HYPO 25X1 1.5 SAFETY (NEEDLE) ×1 IMPLANT
NEEDLE HYPO 25X1 1.5 SAFETY (NEEDLE) ×2 IMPLANT
NS IRRIG 1000ML POUR BTL (IV SOLUTION) ×2 IMPLANT
PACK BASIN DAY SURGERY FS (CUSTOM PROCEDURE TRAY) ×2 IMPLANT
PENCIL SMOKE EVACUATOR (MISCELLANEOUS) ×2 IMPLANT
SLEEVE SCD COMPRESS KNEE MED (STOCKING) ×2 IMPLANT
SPIKE FLUID TRANSFER (MISCELLANEOUS) IMPLANT
SPONGE T-LAP 18X18 ~~LOC~~+RFID (SPONGE) ×2 IMPLANT
STAPLER SKIN PROX WIDE 3.9 (STAPLE) IMPLANT
STRIP CLOSURE SKIN 1/2X4 (GAUZE/BANDAGES/DRESSINGS) ×2 IMPLANT
SUT MON AB 4-0 PC3 18 (SUTURE) ×2 IMPLANT
SUT SILK 2 0 SH (SUTURE) IMPLANT
SUT VIC AB 2-0 SH 18 (SUTURE) IMPLANT
SUT VIC AB 3-0 SH 27X BRD (SUTURE) ×2 IMPLANT
SUT VICRYL 3-0 CR8 SH (SUTURE) IMPLANT
SYR BULB EAR ULCER 3OZ GRN STR (SYRINGE) ×2 IMPLANT
SYR CONTROL 10ML LL (SYRINGE) ×2 IMPLANT
TOWEL GREEN STERILE FF (TOWEL DISPOSABLE) ×2 IMPLANT
TRAY FAXITRON CT DISP (TRAY / TRAY PROCEDURE) ×3 IMPLANT
TUBE CONNECTING 20X1/4 (TUBING) ×2 IMPLANT
YANKAUER SUCT BULB TIP NO VENT (SUCTIONS) ×2 IMPLANT

## 2023-10-23 NOTE — Anesthesia Procedure Notes (Signed)
 Procedure Name: LMA Insertion Date/Time: 10/23/2023 9:17 AM  Performed by: Steffani Edman, CRNAPre-anesthesia Checklist: Patient identified, Emergency Drugs available, Suction available and Patient being monitored Patient Re-evaluated:Patient Re-evaluated prior to induction Oxygen Delivery Method: Circle System Utilized Preoxygenation: Pre-oxygenation with 100% oxygen Induction Type: IV induction Ventilation: Mask ventilation without difficulty LMA: LMA inserted LMA Size: 4.0 Number of attempts: 1 Airway Equipment and Method: Bite block Placement Confirmation: positive ETCO2 Tube secured with: Tape Dental Injury: Teeth and Oropharynx as per pre-operative assessment

## 2023-10-23 NOTE — Anesthesia Postprocedure Evaluation (Signed)
 Anesthesia Post Note  Patient: Vickii Grand  Procedure(s) Performed: EXCISION, MASS, BREAST, USING RADIOLOGICAL MARKER (Right: Breast)     Patient location during evaluation: PACU Anesthesia Type: General Level of consciousness: awake and alert Pain management: pain level controlled Vital Signs Assessment: post-procedure vital signs reviewed and stable Respiratory status: spontaneous breathing, nonlabored ventilation and respiratory function stable Cardiovascular status: blood pressure returned to baseline and stable Postop Assessment: no apparent nausea or vomiting Anesthetic complications: no   No notable events documented.  Last Vitals:  Vitals:   10/23/23 1126 10/23/23 1223  BP:  134/77  Pulse: 67 (!) 55  Resp: 18 16  Temp:  (!) 36.2 C  SpO2: 95% 97%    Last Pain:  Vitals:   10/23/23 1223  TempSrc: Tympanic  PainSc: 2                  Erin Havers

## 2023-10-23 NOTE — Anesthesia Preprocedure Evaluation (Addendum)
 Anesthesia Evaluation  Patient identified by MRN, date of birth, ID band Patient awake    Reviewed: Allergy  & Precautions, NPO status , Patient's Chart, lab work & pertinent test results  History of Anesthesia Complications (+) PONV and history of anesthetic complications  Airway Mallampati: II  TM Distance: >3 FB Neck ROM: Full    Dental  (+) Teeth Intact, Dental Advisory Given   Pulmonary neg pulmonary ROS   Pulmonary exam normal breath sounds clear to auscultation       Cardiovascular Exercise Tolerance: Good negative cardio ROS Normal cardiovascular exam Rhythm:Regular Rate:Normal     Neuro/Psych  Headaches    GI/Hepatic Neg liver ROS,GERD  Medicated,,  Endo/Other  negative endocrine ROS    Renal/GU negative Renal ROS     Musculoskeletal negative musculoskeletal ROS (+)    Abdominal   Peds  (+) ADHD Hematology negative hematology ROS (+)   Anesthesia Other Findings Day of surgery medications reviewed with the patient.   Intraductal papilloma of breast, right  Reproductive/Obstetrics                             Anesthesia Physical Anesthesia Plan  ASA: 2  Anesthesia Plan: General   Post-op Pain Management: Tylenol  PO (pre-op)*   Induction: Intravenous  PONV Risk Score and Plan: 4 or greater and Midazolam , TIVA, Dexamethasone  and Ondansetron   Airway Management Planned: LMA  Additional Equipment:   Intra-op Plan:   Post-operative Plan: Extubation in OR  Informed Consent: I have reviewed the patients History and Physical, chart, labs and discussed the procedure including the risks, benefits and alternatives for the proposed anesthesia with the patient or authorized representative who has indicated his/her understanding and acceptance.     Dental advisory given  Plan Discussed with: CRNA  Anesthesia Plan Comments:        Anesthesia Quick Evaluation

## 2023-10-23 NOTE — Op Note (Signed)
 Right Breast Magseed localized excisional biopsy x 2  Indications: This patient presents with history of abnormal right mammogram with papilloma on core needle biopsy x 2.  One of the papillomas had atypia.  Patient desired excision of the second papilloma as well.  Pre-operative Diagnosis: Right breast papilloma x 2  Post-operative Diagnosis: Right breast papilloma x 2  Surgeon: Lockie Rima   Assistant:  Puja Maczis, PA-C  Anesthesia: General endotracheal anesthesia  ASA Class: 2  Procedure Details  The patient was seen in the Holding Room. The risks, benefits, complications, treatment options, and expected outcomes were discussed with the patient. The possibilities of bleeding, infection, the need for additional procedures, failure to diagnose a condition, and creating a complication requiring transfusion or operation were discussed with the patient. The patient concurred with the proposed plan, giving informed consent.  The site of surgery properly noted/marked. The patient was taken to Operating Room # 8, identified, and the procedure verified as right Breast seed localized excisional biopsy x 2. A Time Out was held and the above information confirmed.  The right breast and chest were prepped and draped in standard fashion. A curvilinear incision was made near the previously placed seeds.  Dissection was carried down around the medial point of maximum signal intensity. The cautery was used to perform the dissection.   The specimen was inked with the margin marker paint kit.    Specimen radiography confirmed inclusion of the mammographic lesion, the clip, and the seed.  The more lateral seed was then addressed.  The clip was not in this specimen so margins were taken and the clip was in the anterior margin.  The background signal in the breast was zero.  Two clips were placed in the breast cavity.  Hemostasis was achieved with cautery.  The wound was irrigated and closed with 3-0 vicryl  interrupted deep dermal sutures and 4-0 monocryl running subcuticular suture.      Sterile dressings were applied. At the end of the operation, all sponge, instrument, and needle counts were correct.  Findings: Seed, clip in specimen.  anterior margin is skin, posterior margin is pectoralis  Estimated Blood Loss:  min         Specimens: 1. lower outer right breast tissue with seed, medial papilloma 2,. Lower outer quadrant right breast tissue lateral specimen 3. Specimen #2 anterior margin 4. Specimen #2 superior margin 4. Specimen #2 posterior margin         Complications:  None; patient tolerated the procedure well.         Disposition: PACU - hemodynamically stable.         Condition: stable

## 2023-10-23 NOTE — Discharge Instructions (Addendum)
 [facility] Office Phone Number [phone]  BREAST BIOPSY/ PARTIAL MASTECTOMY: POST OP INSTRUCTIONS  Always review your discharge instruction sheet given to you by the facility where your surgery was performed.  IF YOU HAVE DISABILITY OR FAMILY LEAVE FORMS, YOU MUST BRING THEM TO THE OFFICE FOR PROCESSING.  DO NOT GIVE THEM TO YOUR DOCTOR.  Take 2 tylenol  (acetominophen) three times a day for 3 days.  If you still have pain, add ibuprofen with food in between if able to take this (if you have kidney issues or stomach issues, do not take ibuprofen).  If both of those are not enough, add the narcotic pain pill.  If you find you are needing a lot of this overnight after surgery, call the next morning for a refill.    Prescriptions will not be filled after 5pm or on week-ends. Take your usually prescribed medications unless otherwise directed You should eat very light the first 24 hours after surgery, such as soup, crackers, pudding, etc.  Resume your normal diet the day after surgery. Most patients will experience some swelling and bruising in the breast.  Ice packs and a good support bra will help.  Swelling and bruising can take several days to resolve.  It is common to experience some constipation if taking pain medication after surgery.  Increasing fluid intake and taking a stool softener will usually help or prevent this problem from occurring.  A mild laxative (Milk of Magnesia or Miralax) should be taken according to package directions if there are no bowel movements after 48 hours. Unless discharge instructions indicate otherwise, you may remove your bandages 48 hours after surgery, and you may shower at that time.  You may have steri-strips (small skin tapes) in place directly over the incision.  These strips should be left on the skin at least for for 7-10 days.    ACTIVITIES:  You may resume regular daily activities (gradually increasing) beginning the next day.  Wearing a  good support bra or sports bra (or the breast binder) minimizes pain and swelling.  You may have sexual intercourse when it is comfortable. No heavy lifting for 1-2 weeks (not over around 10 pounds).  You may drive when you no longer are taking prescription pain medication, you can comfortably wear a seatbelt, and you can safely maneuver your car and apply brakes. RETURN TO WORK:  __________3-14 days depending on job. _______________ Elene Griffes should see your doctor in the office for a follow-up appointment approximately two weeks after your surgery.  Your doctor's nurse will typically make your follow-up appointment when she calls you with your pathology report.  Expect your pathology report 3-4 business days after your surgery.  You may call to check if you do not hear from us  after three days.   WHEN TO CALL YOUR DOCTOR: Fever over 101.0 Nausea and/or vomiting. Extreme swelling or bruising. Continued bleeding from incision. Increased pain, redness, or drainage from the incision.  The clinic staff is available to answer your questions during regular business hours.  Please don't hesitate to call and ask to speak to one of the nurses for clinical concerns.  If you have a medical emergency, go to the nearest emergency room or call 911.  A surgeon from [facility] is always on call at the hospital.  For further questions, please visit [email or web address]    Take next dose tylenol  (acetaminophen ) at 3pm. May take anti-inflammatory medications (ibuprofen/aleve) when desired.   Post Anesthesia Home Care Instructions  Activity: Get plenty of rest for the remainder of the day. A responsible individual must stay with you for 24 hours following the procedure.  For the next 24 hours, DO NOT: -Drive a car -Advertising copywriter -Drink alcoholic beverages -Take any medication unless instructed by your physician -Make any legal decisions or sign important papers.  Meals: Start with  liquid foods such as gelatin or soup. Progress to regular foods as tolerated. Avoid greasy, spicy, heavy foods. If nausea and/or vomiting occur, drink only clear liquids until the nausea and/or vomiting subsides. Call your physician if vomiting continues.  Special Instructions/Symptoms: Your throat may feel dry or sore from the anesthesia or the breathing tube placed in your throat during surgery. If this causes discomfort, gargle with warm salt water. The discomfort should disappear within 24 hours.  If you had a scopolamine patch placed behind your ear for the management of post- operative nausea and/or vomiting:  1. The medication in the patch is effective for 72 hours, after which it should be removed.  Wrap patch in a tissue and discard in the trash. Wash hands thoroughly with soap and water. 2. You may remove the patch earlier than 72 hours if you experience unpleasant side effects which may include dry mouth, dizziness or visual disturbances. 3. Avoid touching the patch. Wash your hands with soap and water after contact with the patch.

## 2023-10-23 NOTE — Interval H&P Note (Signed)
 History and Physical Interval Note:  10/23/2023 8:58 AM  Jillian Francis  has presented today for surgery, with the diagnosis of RIGHT BREAST PAPILLOMA.  The various methods of treatment have been discussed with the patient and family. After consideration of risks, benefits and other options for treatment, the patient has consented to  Procedure(s) with comments: EXCISION, MASS, BREAST, USING RADIOLOGICAL MARKER (Right) - RIGHT BREAST RADIOACTIVE SEED EXCISIONAL BIOPSY X2 as a surgical intervention.  The patient's history has been reviewed, patient examined, no change in status, stable for surgery.  I have reviewed the patient's chart and labs.  Questions were answered to the patient's satisfaction.     Lockie Rima

## 2023-10-23 NOTE — Transfer of Care (Signed)
 Immediate Anesthesia Transfer of Care Note  Patient: Vickii Grand  Procedure(s) Performed: EXCISION, MASS, BREAST, USING RADIOLOGICAL MARKER (Right: Breast)  Patient Location: PACU  Anesthesia Type:General  Level of Consciousness: drowsy and patient cooperative  Airway & Oxygen Therapy: Patient Spontanous Breathing and Patient connected to face mask oxygen  Post-op Assessment: Report given to RN and Post -op Vital signs reviewed and stable  Post vital signs: Reviewed and stable  Last Vitals:  Vitals Value Taken Time  BP 113/63 10/23/23 1042  Temp    Pulse 72 10/23/23 1045  Resp 10 10/23/23 1045  SpO2 98 % 10/23/23 1045  Vitals shown include unfiled device data.  Last Pain:  Vitals:   10/23/23 0733  TempSrc:   PainSc: 0-No pain         Complications: No notable events documented.

## 2023-10-24 ENCOUNTER — Encounter (HOSPITAL_BASED_OUTPATIENT_CLINIC_OR_DEPARTMENT_OTHER): Payer: Self-pay | Admitting: General Surgery

## 2023-10-29 ENCOUNTER — Ambulatory Visit: Payer: Self-pay | Admitting: Obstetrics and Gynecology

## 2023-10-29 LAB — SURGICAL PATHOLOGY

## 2023-11-04 NOTE — Telephone Encounter (Signed)
 Per [facility], pathology is benign, next imaging is screening.

## 2023-11-07 ENCOUNTER — Ambulatory Visit: Admitting: Radiology

## 2023-11-28 ENCOUNTER — Encounter (HOSPITAL_BASED_OUTPATIENT_CLINIC_OR_DEPARTMENT_OTHER): Payer: Self-pay | Admitting: General Surgery

## 2023-11-28 NOTE — OR Nursing (Signed)
 Late entry:  Due to a error with the procedure names and radioactive seeds, this procedure was documented incorrectly the day of surgery.  I reviewed the operative note dictated by the surgeon and corrected the OR record to match the completed procedure.  Berwyn Eagles, RN.

## 2023-12-11 ENCOUNTER — Ambulatory Visit (INDEPENDENT_AMBULATORY_CARE_PROVIDER_SITE_OTHER): Admitting: Radiology

## 2023-12-11 ENCOUNTER — Encounter: Payer: Self-pay | Admitting: Radiology

## 2023-12-11 VITALS — BP 112/64 | HR 76 | Ht 68.31 in | Wt 148.2 lb

## 2023-12-11 DIAGNOSIS — Z01419 Encounter for gynecological examination (general) (routine) without abnormal findings: Secondary | ICD-10-CM | POA: Diagnosis not present

## 2023-12-11 DIAGNOSIS — Z1331 Encounter for screening for depression: Secondary | ICD-10-CM

## 2023-12-11 DIAGNOSIS — N951 Menopausal and female climacteric states: Secondary | ICD-10-CM

## 2023-12-11 DIAGNOSIS — D369 Benign neoplasm, unspecified site: Secondary | ICD-10-CM

## 2023-12-11 LAB — HEPATIC FUNCTION PANEL
AG Ratio: 1.6 (calc) (ref 1.0–2.5)
ALT: 21 U/L (ref 6–29)
AST: 20 U/L (ref 10–35)
Albumin: 4.4 g/dL (ref 3.6–5.1)
Alkaline phosphatase (APISO): 77 U/L (ref 37–153)
Bilirubin, Direct: 0.2 mg/dL (ref 0.0–0.2)
Globulin: 2.7 g/dL (ref 1.9–3.7)
Indirect Bilirubin: 0.8 mg/dL (ref 0.2–1.2)
Total Bilirubin: 1 mg/dL (ref 0.2–1.2)
Total Protein: 7.1 g/dL (ref 6.1–8.1)

## 2023-12-11 MED ORDER — VEOZAH 45 MG PO TABS: 1.0000 | ORAL_TABLET | Freq: Every day | ORAL | 2 refills | Status: DC

## 2023-12-11 NOTE — Progress Notes (Signed)
   Jillian Francis 22-Oct-1970 [id number]   History:  53 y.o. G1P1 presents for annual exam. S/p removal of intraductal papilloma with hyperplasia or right breast. Has nerve pain in that area. Stopped estrogen patch, having hot flashes and night sweats, little improvement with natural supplements.   Gynecologic History Hysterectomy  Sexually active: yes  Health Maintenance Last Pap: 2023. Results were: normal Last mammogram: 3/25 abnormal Last colonoscopy: 2021      12/11/2023   12:18 PM  Depression screen PHQ 2/9  Decreased Interest 0  Down, Depressed, Hopeless 0  PHQ - 2 Score 0     Past medical history, past surgical history, family history and social history were all reviewed and documented in the EPIC chart.  ROS:  A ROS was performed and pertinent positives and negatives are included.  Exam:  Vitals:   12/11/23 1202  BP: 112/64  Pulse: 76  SpO2: 99%  Weight: 148 lb 3.2 oz (67.2 kg)  Height: 5' 8.31 (1.735 m)   Body mass index is 22.33 kg/m.  General appearance:  Normal Thyroid:  Symmetrical, normal in size, without palpable masses or nodularity. Respiratory  Auscultation:  Clear without wheezing or rhonchi Cardiovascular  Auscultation:  Regular rate, without rubs, murmurs or gallops  Edema/varicosities:  Not grossly evident Abdominal  Soft,nontender, without masses, guarding or rebound.  Liver/spleen:  No organomegaly noted  Hernia:  None appreciated  Skin  Inspection:  Grossly normal Breasts: Examined lying and sitting.   Right: Surgical scar lower outer quadrant. Without masses, retractions, nipple discharge or axillary adenopathy.   Left: Without masses, retractions, nipple discharge or axillary adenopathy. Genitourinary   Inguinal/mons:  Normal without inguinal adenopathy  External genitalia:  Normal appearing vulva with no masses, tenderness, or lesions  BUS/Urethra/Skene's glands:  Normal  Vagina:  Normal appearing with normal color and discharge,  no lesions. Atrophy moderate  Cervix:  absent  Uterus:  absent  Adnexa/parametria:     Rt: Normal in size, without masses or tenderness.   Lt: Normal in size, without masses or tenderness.  Anus and perineum: Normal   Darice Hoit, CMA present for exam  Assessment/Plan:   1. Well woman exam with routine gynecological exam (Primary) Pap due 2028 Restart vaginal estrogen  2. Intraductal papilloma with hyperplasia Surgical site healing well Will continue with screening mammograms  3. Vasomotor symptoms due to menopause Stopped patch after breast papilloma found, struggling with hot flashes and trouble sleeping. - Hepatic function panel - Fezolinetant  (VEOZAH ) 45 MG TABS; Take 1 tablet (45 mg total) by mouth daily.  Dispense: 30 tablet; Refill: 2     Return in 1 year for annual or sooner prn.  GINETTE COZIER B WHNP-BC 12:41 PM 12/11/2023

## 2023-12-12 ENCOUNTER — Ambulatory Visit: Payer: Self-pay | Admitting: Radiology

## 2023-12-12 DIAGNOSIS — N951 Menopausal and female climacteric states: Secondary | ICD-10-CM

## 2024-02-17 ENCOUNTER — Other Ambulatory Visit (INDEPENDENT_AMBULATORY_CARE_PROVIDER_SITE_OTHER): Payer: Self-pay

## 2024-02-17 ENCOUNTER — Ambulatory Visit (INDEPENDENT_AMBULATORY_CARE_PROVIDER_SITE_OTHER): Admitting: Orthopaedic Surgery

## 2024-02-17 ENCOUNTER — Encounter: Payer: Self-pay | Admitting: Orthopaedic Surgery

## 2024-02-17 DIAGNOSIS — G8929 Other chronic pain: Secondary | ICD-10-CM

## 2024-02-17 DIAGNOSIS — M25562 Pain in left knee: Secondary | ICD-10-CM | POA: Diagnosis not present

## 2024-02-17 DIAGNOSIS — M25561 Pain in right knee: Secondary | ICD-10-CM

## 2024-02-17 MED ORDER — METHYLPREDNISOLONE ACETATE 40 MG/ML IJ SUSP
40.0000 mg | INTRAMUSCULAR | Status: AC | PRN
  Administered 2024-02-17: 40 mg via INTRA_ARTICULAR

## 2024-02-17 MED ORDER — LIDOCAINE HCL 1 % IJ SOLN
3.0000 mL | INTRAMUSCULAR | Status: AC | PRN
  Administered 2024-02-17: 3 mL

## 2024-02-17 NOTE — Progress Notes (Signed)
 The patient is a very pleasant and active 53 year old female who comes in with bilateral knee pain with cracking and popping.  She says the left knee more squeaks and the right knee pops.  She does workout on a regular basis in the gym.  She also has a remote history of trauma to her left ankle 15 years ago when [place] was out of [place] and tore some ligaments and tendons.  She was in a boot for a long period time.  She reports lateral knee pain but a lot of lateral ankle pain and swelling that occurs on a daily basis.  She is otherwise very active 53 year old female.  She has never had surgery on her knees or injections.  On exam both knees have slight patellofemoral crepitation but no malalignment on exam.  Neither knee has an effusion.  Her left knee does have lateral joint line tenderness and there is a little bit of fullness of the lateral joint line.  Her left ankle though has significant pain anterior to the fibula and is very painful.  There is a lot of swelling posterior to the fibula as well.  X-rays of both knees showed some patellofemoral narrowing but otherwise normal alignment and well-maintained medial lateral compartments.  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  I talked her about avoiding squats and lunges and to work on quad strengthening exercises.  I did recommend a steroid injection for her left knee since her left knee is more symptomatic and she agreed to this and tolerated it well.  We do need to obtain an MRI of her left ankle given the amount of swelling and pain she has around that left ankle.  I think this is causing her to walk differently and affect her left knee.  She agrees with this treatment plan.  She did tolerate the steroid injection well in her left knee.  Will see her back in follow-up in 4 weeks and hopefully can go over MRI of her left ankle at that visit as well.    Procedure Note  Patient: Jillian Francis             Date of Birth: 28-Nov-1970            MRN: [id number]             Visit Date: 02/17/2024  Procedures: Visit Diagnoses:  1. Chronic pain of left knee   2. Chronic pain of right knee     Large Joint Inj: L knee on 02/17/2024 2:37 PM Indications: diagnostic evaluation and pain Details: 22 G 1.5 in needle, superolateral approach  Arthrogram: No  Medications: 3 mL lidocaine  1 %; 40 mg methylPREDNISolone  acetate 40 MG/ML Outcome: tolerated well, no immediate complications Procedure, treatment alternatives, risks and benefits explained, specific risks discussed. Consent was given by the patient. Immediately prior to procedure a time out was called to verify the correct patient, procedure, equipment, support staff and site/side marked as required. Patient was prepped and draped in the usual sterile fashion.

## 2024-02-18 ENCOUNTER — Other Ambulatory Visit: Payer: Self-pay

## 2024-02-18 DIAGNOSIS — M25572 Pain in left ankle and joints of left foot: Secondary | ICD-10-CM

## 2024-02-20 ENCOUNTER — Encounter: Payer: Self-pay | Admitting: Orthopaedic Surgery

## 2024-02-26 ENCOUNTER — Ambulatory Visit
Admission: RE | Admit: 2024-02-26 | Discharge: 2024-02-26 | Disposition: A | Discharge status: Home / Self Care | Source: Ambulatory Visit | Attending: Orthopaedic Surgery | Admitting: Orthopaedic Surgery

## 2024-02-26 DIAGNOSIS — M25572 Pain in left ankle and joints of left foot: Secondary | ICD-10-CM

## 2024-03-02 ENCOUNTER — Ambulatory Visit: Admitting: Obstetrics and Gynecology

## 2024-03-02 ENCOUNTER — Telehealth: Payer: Self-pay | Admitting: Obstetrics and Gynecology

## 2024-03-02 ENCOUNTER — Encounter: Payer: Self-pay | Admitting: Obstetrics and Gynecology

## 2024-03-02 VITALS — BP 126/84 | HR 63

## 2024-03-02 DIAGNOSIS — N6325 Unspecified lump in the left breast, overlapping quadrants: Secondary | ICD-10-CM

## 2024-03-02 DIAGNOSIS — Z5181 Encounter for therapeutic drug level monitoring: Secondary | ICD-10-CM | POA: Diagnosis not present

## 2024-03-02 DIAGNOSIS — N951 Menopausal and female climacteric states: Secondary | ICD-10-CM | POA: Diagnosis not present

## 2024-03-02 DIAGNOSIS — N6452 Nipple discharge: Secondary | ICD-10-CM

## 2024-03-02 NOTE — Progress Notes (Addendum)
 GYNECOLOGY  VISIT   HPI: 53 y.o.   Divorced  Caucasian female   G1P1001 with Patient's last menstrual period was 08/06/2011 (exact date).   here for: Green discharge from R nipple + labs. Hx of right breast biopsy at [facility] 08/23/23, breast surgery 10/23/23.  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    Has some ongoing discomfort, but no change in her baseline discomfort.   States her breasts feel generally lumpy.   No fever, shakes, or chills.  Does not feel flu - like.    On Veozah .   It is helpful. Will get her LFTs today.   She is not using any estrogen treatments.  She prefers not to be on hormones.   GYNECOLOGIC HISTORY: Patient's last menstrual period was 08/06/2011 (exact date). Contraception:  Hyst Menopausal hormone therapy:  Estradiol  tablets & vivelle  - not taking.  Last 2 paps:  06/26/21 neg HR Hpv neg, 05/15/17 neg HPV neg History of abnormal Pap or positive HPV:  no Mammogram:  08/21/23 R Breast- Breast Density Cat C, BIRADS Cat 4 sus, 08/23/23 R Breast biopsy         OB History     Gravida  1   Para  1   Term  1   Preterm  0   AB  0   Living  1      SAB  0   IAB  0   Ectopic  0   Multiple  0   Live Births  1              Patient Active Problem List   Diagnosis Date Noted   Abdominal bloating 06/26/2021   Chronic idiopathic constipation 06/26/2021   Colon cancer screening 06/26/2021   Gastroesophageal reflux disease 06/26/2021   Elective procedure for unacceptable cosmetic appearance 06/26/2021   History of neoplasm 06/26/2021   Lentigo 06/26/2021   Viral warts 06/26/2021   Melanocytic nevus of trunk 06/26/2021   Urinary tract infectious disease 05/02/2015    Past Medical History:  Diagnosis Date   Abnormal Pap smear of cervix    ADHD    Anemia    Fibroid    GERD (gastroesophageal reflux disease)    Headache(784.0)    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 23 g 5   EPINEPHrine  (AUVI-Q ) 0.3 mg/0.3 mL IJ SOAJ injection Inject 0.3 mg into the muscle as needed for anaphylaxis. 1 each 1   Fezolinetant  (VEOZAH ) 45 MG TABS Take 1 tablet (45 mg total) by mouth daily. 30 tablet 2   finasteride (PROPECIA) 1 MG tablet Take 1 mg by mouth daily.     fluconazole  (DIFLUCAN ) 150 MG tablet TAKE 1 TABLET BY MOUTH EVERY 3 DAYS.     gabapentin (NEURONTIN) 100 MG capsule Take 100  mg by mouth 2 (two) times daily.     lisdexamfetamine (VYVANSE) 40 MG capsule Take 40 mg by mouth daily.     Magnesium Gluconate (MAGNESIUM 27 PO) Magnesium     Misc Natural Products (JOINT SUPPORT COMPLEX PO) Joint Support     Multiple Vitamins-Minerals (HAIR SKIN AND NAILS FORMULA PO) HAIR SKIN AND NAILS     NON FORMULARY ASHWAGANDA     NON FORMULARY menopause vitamins     olopatadine (PATANOL) 0.1 % ophthalmic solution Place 2 drops into both eyes at bedtime.     Omeprazole-Sodium Bicarbonate (ZEGERID PO) Take by mouth.     ORLISTAT PO Take by mouth.     Probiotic Product (PROBIOTIC PO) Take by mouth as needed.      tretinoin (ALTRALIN) 0.05 % gel Apply topically daily.     TRULANCE 3 MG TABS 1 tab(s) orally once a day for 90 days     No current facility-administered medications for this visit.     ALLERGIES: Latex, Shellfish allergy , Shrimp (diagnostic), Wound dressing adhesive, and  Morphine and codeine  Family History  Problem Relation Age of Onset   Hypertension Father    Cancer Maternal Grandmother        tumor  on the brain?    Social History   Socioeconomic History   Marital status: Divorced    Spouse name: Not on file   Number of children: 1   Years of education: Not on file   Highest education level: Not on file  Occupational History    Employer: [employer]  Tobacco Use   Smoking status: Never    Passive exposure: Never   Smokeless tobacco: Never  Vaping Use   Vaping status: Never Used  Substance and Sexual Activity   Alcohol use: Yes    Alcohol/week: 2.0 standard drinks of alcohol    Types: 2 Standard drinks or equivalent per week    Comment: weekends   Drug use: No   Sexual activity: Yes    Partners: Male    Birth control/protection: Surgical    Comment: hysterectomy, menarche 53yo, sexual debut 53yo  Other Topics Concern   Not on file  Social History Narrative   Not on file   Social Drivers of Health   Financial Resource Strain: Not on file  Food Insecurity: Not on file  Transportation Needs: Not on file  Physical Activity: Not on file  Stress: Not on file  Social Connections: Not on file  Intimate Partner Violence: Not on file    Review of Systems  All other systems reviewed and are negative.   PHYSICAL EXAMINATION:   BP 126/84 (BP Location: Left Arm, Patient Position: Sitting)   Pulse 63   LMP 08/06/2011 (Exact Date)   SpO2 97%     General appearance: alert, cooperative and appears stated age Breasts: right - scar noted,  no masses or tenderness, No nipple retraction or dimpling, clear orange nipple discharge from one duct, No axillary or supraclavicular adenopathy Left - normal appearance, 1 cm mass at 12:00, no tenderness, No nipple retraction or dimpling, no nipple discharge, No axillary or supraclavicular adenopathy  Chaperone was present for exam:  Kari HERO, CMA  ASSESSMENT:  Right nipple discharge.   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  Left breast mass.  On Veozah . Doing well.  Off all estrogen prescriptions.   PLAN:  Proceed with bilateral dx mammogram and bilateral ultrasound at [facility].  I anticipate referral back to  Dr. Aron.  Check LFTs today.    25 min  total time was spent for this patient encounter, including preparation, face-to-face counseling with the patient, coordination of care, and documentation of the encounter.

## 2024-03-02 NOTE — Telephone Encounter (Signed)
 Order printed and to Dr. Nikki to be signed and faxed to [facility].   Spoke with Terri at [facility], scheduled for Bilateral Dx MMG and bilateral US  on 03/12/24 at 0815.   Patient notified.   Encounter closed.

## 2024-03-02 NOTE — Telephone Encounter (Signed)
 Please schedule dx bilateral mammogram and bilateral breast US  at [facility].   Patient has right nipple discharge and recent dx of right breast papilloma and atypical ductal hyperplasia.   She also has a left breast lump at 12:00 which is about 1 cm in size.

## 2024-03-02 NOTE — Telephone Encounter (Signed)
 Spoke with patient. Patient reports green nipple discharge in right breast that started this morning. Denies pain, fever/chills, itching, skin changes. Hx of right breast biopsy at [facility] 08/23/23, results in EPIC.   Last AEX 12/11/23 with JC  Patient agreeable to OV. OV scheduled for today at 1330 with Dr. Nikki. Patient aware she will be contacted if any additional recommendations.   Patient also due for 3 mo LFT for Veozah , lab appt cancelled, will have this drawn today while in office.    Routing to provider for final review. Patient is agreeable to disposition. Will close encounter.  Cc: Jami

## 2024-03-03 ENCOUNTER — Other Ambulatory Visit: Payer: Self-pay | Admitting: Radiology

## 2024-03-03 ENCOUNTER — Ambulatory Visit: Payer: Self-pay | Admitting: Radiology

## 2024-03-03 DIAGNOSIS — N951 Menopausal and female climacteric states: Secondary | ICD-10-CM

## 2024-03-03 LAB — HEPATIC FUNCTION PANEL
AG Ratio: 1.7 (calc) (ref 1.0–2.5)
ALT: 17 U/L (ref 6–29)
AST: 17 U/L (ref 10–35)
Albumin: 4.5 g/dL (ref 3.6–5.1)
Alkaline phosphatase (APISO): 75 U/L (ref 37–153)
Bilirubin, Direct: 0.2 mg/dL (ref 0.0–0.2)
Globulin: 2.6 g/dL (ref 1.9–3.7)
Indirect Bilirubin: 0.9 mg/dL (ref 0.2–1.2)
Total Bilirubin: 1.1 mg/dL (ref 0.2–1.2)
Total Protein: 7.1 g/dL (ref 6.1–8.1)

## 2024-03-03 MED ORDER — VEOZAH 45 MG PO TABS: 1.0000 | ORAL_TABLET | Freq: Every day | ORAL | 2 refills | Status: AC

## 2024-03-03 NOTE — Telephone Encounter (Signed)
 Call to patient. Patient notified of normal labs and verbalized understanding. Patient requesting refill of Veozah  to the [facility] on [address] in [place].   Medication pended for #30, 2RF. Please refill if appropriate.

## 2024-03-12 ENCOUNTER — Other Ambulatory Visit

## 2024-03-12 LAB — HM MAMMOGRAPHY

## 2024-03-13 ENCOUNTER — Encounter: Payer: Self-pay | Admitting: Obstetrics and Gynecology

## 2024-03-15 ENCOUNTER — Ambulatory Visit: Payer: Self-pay | Admitting: Obstetrics and Gynecology

## 2024-03-16 ENCOUNTER — Telehealth: Payer: Self-pay | Admitting: Obstetrics and Gynecology

## 2024-03-16 NOTE — Telephone Encounter (Signed)
 Please contact [facility] in follow up to a request for me to sign an order for a contrast enhanced mammogram for my patient on 03/25/24 at 10:00.   She has an allergy  to shellfish which cause throat swelling and facial redness.   [facility] is asking me to sign send in an order for prednisone for the patient.    I am not comfortable with her receiving contrast.   What alternatives exist to the contrast mammogram?

## 2024-03-16 NOTE — Telephone Encounter (Signed)
 Call placed to [facility], patient care coordinator at [facility], left detailed message per Dr. Cyrilla request. Advised to return call with recommendations.

## 2024-03-17 MED ORDER — PREDNISONE 50 MG PO TABS: ORAL_TABLET | ORAL | 0 refills | Status: AC

## 2024-03-17 MED ORDER — DIPHENHYDRAMINE HCL 50 MG PO TABS: ORAL_TABLET | ORAL | 0 refills | Status: AC

## 2024-03-17 NOTE — Telephone Encounter (Signed)
 Spoke with Jillian Francis at [facility]. Reviewed with Dr. Vonzell. Contrast MMG was recommended prior to biopsy, if contrast MMG negative biopsy may not be needed. States no contraindication with contrast used and shellfish, prednisone is given as extra precaution.   Alternative is to proceed with biopsy, no contrast MMG.

## 2024-03-17 NOTE — Telephone Encounter (Signed)
 Please let patient know that I have sent the following prescriptions to her pharmacy in preparation for the contrast usage.  These help to avoid an allergic reaction to the contrast for her mammogram.   - Prednisone 50 mg by mouth 13 hours prior to contrast, 50 mg by mouth 7 hour prior to contrast, 50 mg by mouth 1 hour prior to contrast.   - Benadryl 50 mg by mouth 1 hour prior to contrast.   The prednisone can cause nausea and insomnia, and benadryl can cause sleepiness.   I recommend she has someone drive her to the appointment and then back home afterward.

## 2024-03-18 ENCOUNTER — Ambulatory Visit: Admitting: Orthopaedic Surgery

## 2024-03-18 NOTE — Telephone Encounter (Signed)
 Spoke with patient, advised as per Dr. Nikki. Patient read back instructions. Patient verbalizes understanding and is agreeable.   Encounter closed.

## 2024-03-22 ENCOUNTER — Other Ambulatory Visit: Payer: Self-pay

## 2024-03-22 ENCOUNTER — Emergency Department (HOSPITAL_BASED_OUTPATIENT_CLINIC_OR_DEPARTMENT_OTHER)

## 2024-03-22 ENCOUNTER — Emergency Department (HOSPITAL_BASED_OUTPATIENT_CLINIC_OR_DEPARTMENT_OTHER)
Admission: EM | Admit: 2024-03-22 | Discharge: 2024-03-22 | Disposition: A | Discharge status: Home / Self Care | Attending: Emergency Medicine | Admitting: Emergency Medicine

## 2024-03-22 ENCOUNTER — Encounter (HOSPITAL_BASED_OUTPATIENT_CLINIC_OR_DEPARTMENT_OTHER): Payer: Self-pay | Admitting: Emergency Medicine

## 2024-03-22 DIAGNOSIS — K5732 Diverticulitis of large intestine without perforation or abscess without bleeding: Secondary | ICD-10-CM | POA: Diagnosis not present

## 2024-03-22 DIAGNOSIS — K5792 Diverticulitis of intestine, part unspecified, without perforation or abscess without bleeding: Secondary | ICD-10-CM

## 2024-03-22 DIAGNOSIS — R1031 Right lower quadrant pain: Secondary | ICD-10-CM | POA: Diagnosis present

## 2024-03-22 DIAGNOSIS — Z91040 Latex allergy status: Secondary | ICD-10-CM | POA: Insufficient documentation

## 2024-03-22 LAB — CBC WITH DIFFERENTIAL/PLATELET
Abs Immature Granulocytes: 0.05 K/uL (ref 0.00–0.07)
Basophils Absolute: 0 K/uL (ref 0.0–0.1)
Basophils Relative: 0 %
Eosinophils Absolute: 0.1 K/uL (ref 0.0–0.5)
Eosinophils Relative: 1 %
HCT: 38.7 % (ref 36.0–46.0)
Hemoglobin: 12.6 g/dL (ref 12.0–15.0)
Immature Granulocytes: 0 %
Lymphocytes Relative: 10 %
Lymphs Abs: 1.1 K/uL (ref 0.7–4.0)
MCH: 30.3 pg (ref 26.0–34.0)
MCHC: 32.6 g/dL (ref 30.0–36.0)
MCV: 93 fL (ref 80.0–100.0)
Monocytes Absolute: 0.6 K/uL (ref 0.1–1.0)
Monocytes Relative: 6 %
Neutro Abs: 9.2 K/uL — ABNORMAL HIGH (ref 1.7–7.7)
Neutrophils Relative %: 83 %
Platelets: 314 K/uL (ref 150–400)
RBC: 4.16 MIL/uL (ref 3.87–5.11)
RDW: 13.3 % (ref 11.5–15.5)
WBC: 11.2 K/uL — ABNORMAL HIGH (ref 4.0–10.5)
nRBC: 0 % (ref 0.0–0.2)

## 2024-03-22 LAB — COMPREHENSIVE METABOLIC PANEL WITH GFR
ALT: 19 U/L (ref 0–44)
AST: 26 U/L (ref 15–41)
Albumin: 4.3 g/dL (ref 3.5–5.0)
Alkaline Phosphatase: 93 U/L (ref 38–126)
Anion gap: 13 (ref 5–15)
BUN: 8 mg/dL (ref 6–20)
CO2: 25 mmol/L (ref 22–32)
Calcium: 9.6 mg/dL (ref 8.9–10.3)
Chloride: 100 mmol/L (ref 98–111)
Creatinine, Ser: 0.72 mg/dL (ref 0.44–1.00)
GFR, Estimated: >60 mL/min (ref >60)
Glucose, Bld: 96 mg/dL (ref 70–99)
Potassium: 4.1 mmol/L (ref 3.5–5.1)
Sodium: 139 mmol/L (ref 135–145)
Total Bilirubin: 0.6 mg/dL (ref 0.0–1.2)
Total Protein: 7.7 g/dL (ref 6.5–8.1)

## 2024-03-22 LAB — URINALYSIS, MICROSCOPIC (REFLEX): RBC / HPF: NONE SEEN RBC/hpf (ref 0–5)

## 2024-03-22 LAB — URINALYSIS, ROUTINE W REFLEX MICROSCOPIC: Nitrite: NEGATIVE

## 2024-03-22 LAB — LIPASE, BLOOD: Lipase: 19 U/L (ref 11–51)

## 2024-03-22 MED ORDER — METRONIDAZOLE 500 MG PO TABS: 500.0000 mg | ORAL_TABLET | Freq: Three times a day (TID) | ORAL | 0 refills | Status: AC

## 2024-03-22 MED ORDER — ONDANSETRON 4 MG PO TBDP: 4.0000 mg | ORAL_TABLET | Freq: Three times a day (TID) | ORAL | 0 refills | Status: AC | PRN

## 2024-03-22 MED ORDER — HYDROMORPHONE HCL 1 MG/ML IJ SOLN
1.0000 mg | Freq: Once | INTRAMUSCULAR | Status: AC
  Administered 2024-03-22: 1 mg via INTRAVENOUS
  Filled 2024-03-22: qty 1

## 2024-03-22 MED ORDER — KETOROLAC TROMETHAMINE 30 MG/ML IJ SOLN
30.0000 mg | Freq: Once | INTRAMUSCULAR | Status: AC
  Administered 2024-03-22: 30 mg via INTRAVENOUS
  Filled 2024-03-22: qty 1

## 2024-03-22 MED ORDER — CIPROFLOXACIN HCL 500 MG PO TABS
500.0000 mg | ORAL_TABLET | Freq: Once | ORAL | Status: AC
  Administered 2024-03-22: 500 mg via ORAL
  Filled 2024-03-22: qty 1

## 2024-03-22 MED ORDER — METRONIDAZOLE 500 MG PO TABS
500.0000 mg | ORAL_TABLET | Freq: Once | ORAL | Status: AC
  Administered 2024-03-22: 500 mg via ORAL
  Filled 2024-03-22: qty 1

## 2024-03-22 MED ORDER — CIPROFLOXACIN HCL 500 MG PO TABS: 500.0000 mg | ORAL_TABLET | Freq: Two times a day (BID) | ORAL | 0 refills | Status: AC

## 2024-03-22 MED ORDER — FLUCONAZOLE 200 MG PO TABS: 200.0000 mg | ORAL_TABLET | Freq: Every day | ORAL | 0 refills | Status: AC | PRN

## 2024-03-22 MED ORDER — IOHEXOL 300 MG/ML  SOLN
100.0000 mL | Freq: Once | INTRAMUSCULAR | Status: AC | PRN
Start: 2024-03-22 — End: 2024-03-22
  Administered 2024-03-22: 100 mL via INTRAVENOUS

## 2024-03-22 NOTE — ED Provider Triage Note (Signed)
 Emergency Medicine Provider Triage Evaluation Note  Jillian Francis , a 53 y.o. female  was evaluated in triage.  Pt complains of lower abdominal pain x 5 days with urinary symptoms.  Patient reports it feels like pain is radiating down to her pelvis.  Patient denies any vomiting, GI symptoms.  Review of Systems  Positive: Dysuria, foul odor to urine, lower abdominal pain Negative: Shortness of breath chest pain diaphoresis  Physical Exam  BP (!) 149/74   Pulse 86   Temp 98.5 F (36.9 C)   Resp 20   Ht 5' 8.5 (1.74 m)   Wt 65.8 kg   LMP 08/06/2011 (Exact Date)   SpO2 100%   BMI 21.73 kg/m  Gen:   Awake, no distress   Resp:  Normal effort  MSK:   Moves extremitie without difficulty  Other:    Medical Decision Making  Medically screening exam initiated at 3:09 PM.  Appropriate orders placed.  Jillian Francis was informed that the remainder of the evaluation will be completed by another provider, this initial triage assessment does not replace that evaluation, and the importance of remaining in the ED until their evaluation is complete.     Jillian Francis, [place] 03/22/24 1510

## 2024-03-22 NOTE — ED Provider Notes (Signed)
 [facility] Provider Note   CSN: [id number] Arrival date & time: 03/22/24  1250     Patient presents with: Abdominal Pain   Jillian Francis is a 53 y.o. female presenting to the ED with 5 days of right lower quadrant and suprapubic pain, pressure with urination, feels like knives in my lower belly, nausea with eating specifically.  Regular BM today.  Hx of partial hysterectomy (uterus removed) and C-section, no other abdominal surgeries.  Pain is 10/10, worse with lying down and any movement. She took macrobid  and azo at home thinking this might be a UTI.   HPI     Prior to Admission medications   Medication Sig Start Date End Date Taking? Authorizing Provider  ciprofloxacin  (CIPRO ) 500 MG tablet Take 1 tablet (500 mg total) by mouth 2 (two) times daily for 7 days. 03/22/24 03/29/24 Yes Jaser Fullen, Donnice JINNY, MD  fluconazole  (DIFLUCAN ) 200 MG tablet Take 1 tablet (200 mg total) by mouth daily as needed for up to 2 doses. 03/22/24  Yes Kimiye Strathman, Donnice JINNY, MD  metroNIDAZOLE  (FLAGYL ) 500 MG tablet Take 1 tablet (500 mg total) by mouth 3 (three) times daily for 7 days. 03/22/24 03/29/24 Yes Jaelin Devincentis, Donnice JINNY, MD  ondansetron  (ZOFRAN -ODT) 4 MG disintegrating tablet Take 1 tablet (4 mg total) by mouth every 8 (eight) hours as needed for up to 12 doses for nausea or vomiting. 03/22/24  Yes Anyjah Roundtree, Donnice JINNY, MD  Azelastine -Fluticasone  (DYMISTA ) 137-50 MCG/ACT SUSP Place 1 spray into the nose in the morning and at bedtime. 03/22/23   Lorin Norris, MD  diphenhydrAMINE (BENADRYL) 50 MG tablet Take one tablet (50 mg) by mouth 1 hour prior to contrast. 03/17/24   Amundson C Silva, Brook E, MD  EPINEPHrine  (AUVI-Q ) 0.3 mg/0.3 mL IJ SOAJ injection Inject 0.3 mg into the muscle as needed for anaphylaxis. 03/20/23   Lorin Norris, MD  Fezolinetant  (VEOZAH ) 45 MG TABS Take 1 tablet (45 mg total) by mouth daily. 03/03/24   Chrzanowski, Jami B, NP   finasteride (PROPECIA) 1 MG tablet Take 1 mg by mouth daily. 06/26/23   [provider]  fluconazole  (DIFLUCAN ) 150 MG tablet TAKE 1 TABLET BY MOUTH EVERY 3 DAYS. 05/18/23   [provider]  gabapentin (NEURONTIN) 100 MG capsule Take 100 mg by mouth 2 (two) times daily. 11/15/23 11/14/24  [provider]  lisdexamfetamine (VYVANSE) 40 MG capsule Take 40 mg by mouth daily.    [provider]  Magnesium Gluconate (MAGNESIUM 27 PO) Magnesium    [provider]  Misc Natural Products (JOINT SUPPORT COMPLEX PO) Joint Support    [provider]  Multiple Vitamins-Minerals (HAIR SKIN AND NAILS FORMULA PO) HAIR SKIN AND NAILS    [provider]  NON [facility] ASHWAGANDA    [provider]  NON FORMULARY menopause vitamins    [provider]  olopatadine (PATANOL) 0.1 % ophthalmic solution Place 2 drops into both eyes at bedtime.    [provider]  Omeprazole-Sodium Bicarbonate (ZEGERID PO) Take by mouth.    [provider]  ORLISTAT PO Take by mouth.    [provider]  predniSONE (DELTASONE) 50 MG tablet Take one tablet (50 mg) by mouth 13 hours prior to contrast.  Take one tablet (50 mg) by mouth 7 hours prior to contrast.  Take one tablet (50 mg) by mouth 1 hour prior to contrast. 03/17/24   Amundson C Silva, Brook E, MD  Probiotic  Product (PROBIOTIC PO) Take by mouth as needed.     [provider]  tretinoin (ALTRALIN) 0.05 % gel Apply topically daily. 02/25/24   [provider]  TRULANCE 3 MG TABS 1 tab(s) orally once a day for 90 days 09/26/23   [provider]    Allergies: Latex, Shellfish allergy , Shrimp (diagnostic), Wound dressing adhesive, and Morphine and codeine    Review of Systems  Updated Vital Signs BP (!) 147/96   Pulse 71   Temp 98.3 F (36.8 C) (Oral)   Resp 16   Ht 5' 8.5 (1.74 m)   Wt 65.8 kg   LMP 08/06/2011 (Exact Date)   SpO2 100%    BMI 21.73 kg/m   Physical Exam Constitutional:      General: She is not in acute distress. HENT:     Head: Normocephalic and atraumatic.  Eyes:     Conjunctiva/sclera: Conjunctivae normal.     Pupils: Pupils are equal, round, and reactive to light.  Cardiovascular:     Rate and Rhythm: Normal rate and regular rhythm.  Pulmonary:     Effort: Pulmonary effort is normal. No respiratory distress.  Abdominal:     General: There is no distension.     Tenderness: There is abdominal tenderness in the right lower quadrant and suprapubic area.  Skin:    General: Skin is warm and dry.  Neurological:     General: No focal deficit present.     Mental Status: She is alert. Mental status is at baseline.  Psychiatric:        Mood and Affect: Mood normal.        Behavior: Behavior normal.     (all labs ordered are listed, but only abnormal results are displayed) Labs Reviewed  URINALYSIS, ROUTINE W REFLEX MICROSCOPIC - Abnormal; Notable for the following components:      Result Value   Color, Urine ORANGE (*)    APPearance TURBID (*)    Glucose, UA   (*)    Value: TEST NOT REPORTED DUE TO COLOR INTERFERENCE OF URINE PIGMENT   Hgb urine dipstick   (*)    Value: TEST NOT REPORTED DUE TO COLOR INTERFERENCE OF URINE PIGMENT   Bilirubin Urine   (*)    Value: TEST NOT REPORTED DUE TO COLOR INTERFERENCE OF URINE PIGMENT   Ketones, ur   (*)    Value: TEST NOT REPORTED DUE TO COLOR INTERFERENCE OF URINE PIGMENT   Protein, ur   (*)    Value: TEST NOT REPORTED DUE TO COLOR INTERFERENCE OF URINE PIGMENT   Leukocytes,Ua   (*)    Value: TEST NOT REPORTED DUE TO COLOR INTERFERENCE OF URINE PIGMENT   All other components within normal limits  CBC WITH DIFFERENTIAL/PLATELET - Abnormal; Notable for the following components:   WBC 11.2 (*)    Neutro Abs 9.2 (*)    All other components within normal limits  URINALYSIS, MICROSCOPIC (REFLEX) - Abnormal; Notable for the following components:   Bacteria,  UA RARE (*)    All other components within normal limits  LIPASE, BLOOD  COMPREHENSIVE METABOLIC PANEL WITH GFR    EKG: None  Radiology: CT ABDOMEN PELVIS W CONTRAST Result Date: 03/22/2024 CLINICAL DATA:  Right lower quadrant and lower pelvic pain for 3 days, nausea, dysuria EXAM: CT ABDOMEN AND PELVIS WITH CONTRAST TECHNIQUE: Multidetector CT imaging of the abdomen and pelvis was performed using the standard protocol following bolus administration of intravenous contrast. RADIATION DOSE REDUCTION:  This exam was performed according to the departmental dose-optimization program which includes automated exposure control, adjustment of the mA and/or kV according to patient size and/or use of iterative reconstruction technique. CONTRAST:  100mL OMNIPAQUE IOHEXOL 300 MG/ML  SOLN COMPARISON:  None Available. FINDINGS: Lower chest: No acute pleural or parenchymal lung disease. Hepatobiliary: No focal liver abnormality is seen. No gallstones, gallbladder wall thickening, or biliary dilatation. Pancreas: Unremarkable. No pancreatic ductal dilatation or surrounding inflammatory changes. Spleen: Normal in size without focal abnormality. Adrenals/Urinary Tract: Adrenal glands are unremarkable. Kidneys are normal, without renal calculi, focal lesion, or hydronephrosis. Bladder is unremarkable. Stomach/Bowel: No bowel obstruction or ileus. There is diffuse colonic diverticulosis, with mural thickening and fat stranding within the rectosigmoid colon consistent with superimposed acute diverticulitis. No perforation, fluid collection, or abscess. Normal retrocecal appendix. Vascular/Lymphatic: No significant vascular findings are present. No enlarged abdominal or pelvic lymph nodes. Reproductive: Status post hysterectomy. No adnexal masses. Other: Trace pelvic free fluid. No free intraperitoneal gas. No abdominal wall hernia. Musculoskeletal: No acute or destructive bony abnormalities. Reconstructed images demonstrate  no additional findings. IMPRESSION: 1. Acute uncomplicated diverticulitis of the rectosigmoid colon. No perforation, fluid collection, or abscess. 2. Trace pelvic free fluid, likely reactive. Electronically Signed   By: Ozell Daring M.D.   On: 03/22/2024 17:46     Procedures   Medications Ordered in the ED  HYDROmorphone  (DILAUDID ) injection 1 mg (1 mg Intravenous Given 03/22/24 1619)  ketorolac  (TORADOL ) 30 MG/ML injection 30 mg (30 mg Intravenous Given 03/22/24 1619)  iohexol (OMNIPAQUE) 300 MG/ML solution 100 mL (100 mLs Intravenous Contrast Given 03/22/24 1700)  ciprofloxacin  (CIPRO ) tablet 500 mg (500 mg Oral Given 03/22/24 1821)  metroNIDAZOLE  (FLAGYL ) tablet 500 mg (500 mg Oral Given 03/22/24 1821)                                    Medical Decision Making Amount and/or Complexity of Data Reviewed Labs: ordered.  Risk Prescription drug management.   This patient presents to the ED with concern for lower abdominal pain, nausea. This involves an extensive number of treatment options, and is a complaint that carries with it a high risk of complications and morbidity.  The differential diagnosis includes diverticulitis versus appendicitis versus ileus versus pancreatitis versus other   I ordered and personally interpreted labs.  The pertinent results include: White blood cell count 11.2.  UA was unable to be run due to patient's use of Azo, color discoloration.  LFTs and lipase normal  I ordered imaging studies including CT abdomen pelvis I independently visualized and interpreted imaging which showed acute uncomplicated diverticulitis I agree with the radiologist interpretation  I ordered medication including IV pain medication, ciprofloxacin  and Flagyl  for diverticulitis  I have reviewed the patients home medicines and have made adjustments as needed  Test Considered: Lower suspicion for pelvic cause of the patient's symptoms  After the interventions noted above, I  reevaluated the patient and found that they have: improved  The patient's pain was improved, and I did not feel that she required hospitalization at this time.  She is quite comfortable and wanting to go home, and is here with her boyfriend who can help her at home.  Disposition:  After consideration of the diagnostic results and the patients response to treatment, I feel that the patient would benefit from close outpatient follow-up      Final diagnoses:  Diverticulitis  ED Discharge Orders          Ordered    ciprofloxacin  (CIPRO ) 500 MG tablet  2 times daily        03/22/24 1829    metroNIDAZOLE  (FLAGYL ) 500 MG tablet  3 times daily        03/22/24 1829    ondansetron  (ZOFRAN -ODT) 4 MG disintegrating tablet  Every 8 hours PRN        03/22/24 1829    fluconazole  (DIFLUCAN ) 200 MG tablet  Daily PRN        03/22/24 1829               Cottie Donnice PARAS, MD 03/22/24 2223

## 2024-03-22 NOTE — ED Triage Notes (Signed)
 Pt c/o RLQ and lower pelvic pain x 3d; +nausea and dysuria; started Macrobid  yesterday, no improvement today

## 2024-03-25 ENCOUNTER — Other Ambulatory Visit: Payer: Self-pay

## 2024-03-25 LAB — HM MAMMOGRAPHY

## 2024-03-26 LAB — SURGICAL PATHOLOGY

## 2024-03-27 ENCOUNTER — Encounter: Payer: Self-pay | Admitting: Obstetrics and Gynecology

## 2024-03-30 ENCOUNTER — Ambulatory Visit: Payer: Self-pay | Admitting: Obstetrics and Gynecology

## 2024-04-01 ENCOUNTER — Ambulatory Visit: Admitting: Orthopaedic Surgery

## 2024-04-06 ENCOUNTER — Encounter: Payer: Self-pay | Admitting: Radiology

## 2024-04-10 ENCOUNTER — Other Ambulatory Visit: Payer: Self-pay | Admitting: Internal Medicine

## 2024-04-13 ENCOUNTER — Telehealth: Payer: Self-pay

## 2024-04-13 NOTE — Telephone Encounter (Signed)
*  AA  Pharmacy Patient Advocate Encounter   Received notification from CoverMyMeds that prior authorization for Azelastine -Fluticasone  137-50MCG/ACT suspension  is required/requested.   Insurance verification completed.   The patient is insured through [employer].   Per test claim:  Trial and failure of Fluticasone  Nasal Spray is preferred by the insurance.  If suggested medication is appropriate, Please send in a new RX and discontinue this one. If not, please advise as to why it's not appropriate so that we may request a Prior Authorization. Please note, some preferred medications may still require a PA.  If the suggested medications have not been trialed and there are no contraindications to their use, the PA will not be submitted, as it will not be approved.  CMM:  [id number]

## 2024-04-22 MED ORDER — FLUTICASONE PROPIONATE 50 MCG/ACT NA SUSP: 2.0000 | Freq: Every day | NASAL | 2 refills | Status: AC

## 2024-04-22 MED ORDER — AZELASTINE HCL 0.1 % NA SOLN: 1.0000 | Freq: Two times a day (BID) | NASAL | 5 refills | Status: AC | PRN

## 2024-04-22 NOTE — Telephone Encounter (Signed)
 Sent Fluticasone  and Azelastine  separately.

## 2024-05-05 ENCOUNTER — Other Ambulatory Visit: Payer: Self-pay | Admitting: Radiology

## 2024-05-05 NOTE — Telephone Encounter (Signed)
 Med refill request: nitrofurantoin , macrocrystal-monohydrate, (MACROBID ) 100 MG capsule  Start:  I cannot seem to find this medication on Pt's Med List. Please advise. Disp:  30 capsules Refills:  0  Last AEX: 12/11/23 Next AEX:  Not yet scheduled Last MMG (if hormonal med):  N/A Refill authorized? Please Advise.

## 2024-05-19 ENCOUNTER — Other Ambulatory Visit: Payer: Self-pay

## 2024-05-19 ENCOUNTER — Observation Stay (HOSPITAL_BASED_OUTPATIENT_CLINIC_OR_DEPARTMENT_OTHER)
Admission: EM | Admit: 2024-05-19 | Discharge: 2024-05-21 | Disposition: A | Attending: Internal Medicine | Admitting: Internal Medicine

## 2024-05-19 ENCOUNTER — Encounter: Payer: Self-pay | Admitting: General Surgery

## 2024-05-19 DIAGNOSIS — K625 Hemorrhage of anus and rectum: Secondary | ICD-10-CM | POA: Diagnosis present

## 2024-05-19 DIAGNOSIS — D62 Acute posthemorrhagic anemia: Secondary | ICD-10-CM | POA: Diagnosis not present

## 2024-05-19 DIAGNOSIS — K5730 Diverticulosis of large intestine without perforation or abscess without bleeding: Secondary | ICD-10-CM | POA: Diagnosis not present

## 2024-05-19 DIAGNOSIS — F909 Attention-deficit hyperactivity disorder, unspecified type: Secondary | ICD-10-CM | POA: Diagnosis not present

## 2024-05-19 DIAGNOSIS — E876 Hypokalemia: Secondary | ICD-10-CM | POA: Diagnosis not present

## 2024-05-19 DIAGNOSIS — K219 Gastro-esophageal reflux disease without esophagitis: Secondary | ICD-10-CM | POA: Diagnosis not present

## 2024-05-19 DIAGNOSIS — K317 Polyp of stomach and duodenum: Secondary | ICD-10-CM | POA: Diagnosis not present

## 2024-05-19 DIAGNOSIS — Z91040 Latex allergy status: Secondary | ICD-10-CM | POA: Diagnosis not present

## 2024-05-19 DIAGNOSIS — K648 Other hemorrhoids: Secondary | ICD-10-CM | POA: Diagnosis not present

## 2024-05-19 LAB — CBC
HCT: 33.1 % — ABNORMAL LOW (ref 36.0–46.0)
Hemoglobin: 10.8 g/dL — ABNORMAL LOW (ref 12.0–15.0)
MCH: 29.8 pg (ref 26.0–34.0)
MCHC: 32.6 g/dL (ref 30.0–36.0)
MCV: 91.4 fL (ref 80.0–100.0)
Platelets: 310 K/uL (ref 150–400)
RBC: 3.62 MIL/uL — ABNORMAL LOW (ref 3.87–5.11)
RDW: 14 % (ref 11.5–15.5)
WBC: 6 K/uL (ref 4.0–10.5)
nRBC: 0 % (ref 0.0–0.2)

## 2024-05-19 LAB — COMPREHENSIVE METABOLIC PANEL WITH GFR
ALT: 17 U/L (ref 0–44)
AST: 20 U/L (ref 15–41)
Albumin: 4.3 g/dL (ref 3.5–5.0)
Alkaline Phosphatase: 71 U/L (ref 38–126)
Anion gap: 13 (ref 5–15)
BUN: 30 mg/dL — ABNORMAL HIGH (ref 6–20)
CO2: 24 mmol/L (ref 22–32)
Calcium: 9.1 mg/dL (ref 8.9–10.3)
Chloride: 105 mmol/L (ref 98–111)
Creatinine, Ser: 0.73 mg/dL (ref 0.44–1.00)
GFR, Estimated: >60 mL/min (ref >60)
Glucose, Bld: 86 mg/dL (ref 70–99)
Potassium: 3.9 mmol/L (ref 3.5–5.1)
Sodium: 141 mmol/L (ref 135–145)
Total Bilirubin: 0.5 mg/dL (ref 0.0–1.2)
Total Protein: 6.7 g/dL (ref 6.5–8.1)

## 2024-05-19 NOTE — ED Triage Notes (Signed)
 Patient reports bloody stools x4 that started at 1900. PMH: diverticulitis

## 2024-05-20 ENCOUNTER — Emergency Department (HOSPITAL_BASED_OUTPATIENT_CLINIC_OR_DEPARTMENT_OTHER)

## 2024-05-20 DIAGNOSIS — K922 Gastrointestinal hemorrhage, unspecified: Secondary | ICD-10-CM | POA: Diagnosis present

## 2024-05-20 DIAGNOSIS — D62 Acute posthemorrhagic anemia: Secondary | ICD-10-CM | POA: Diagnosis present

## 2024-05-20 DIAGNOSIS — Z7401 Bed confinement status: Secondary | ICD-10-CM | POA: Diagnosis not present

## 2024-05-20 DIAGNOSIS — I959 Hypotension, unspecified: Secondary | ICD-10-CM | POA: Diagnosis not present

## 2024-05-20 LAB — HEMOGLOBIN AND HEMATOCRIT, BLOOD
HCT: 26 % — ABNORMAL LOW (ref 36.0–46.0)
HCT: 27.7 % — ABNORMAL LOW (ref 36.0–46.0)
HCT: 27.2 % — ABNORMAL LOW (ref 36.0–46.0)
Hemoglobin: 8.4 g/dL — ABNORMAL LOW (ref 12.0–15.0)
Hemoglobin: 8.9 g/dL — ABNORMAL LOW (ref 12.0–15.0)
Hemoglobin: 8.9 g/dL — ABNORMAL LOW (ref 12.0–15.0)

## 2024-05-20 LAB — PREPARE RBC (CROSSMATCH)

## 2024-05-20 LAB — ABO/RH: ABO/RH(D): O POS

## 2024-05-20 MED ORDER — ONDANSETRON HCL 4 MG/2ML IJ SOLN: 4.0000 mg | Freq: Once | INTRAMUSCULAR | Status: DC

## 2024-05-20 MED ORDER — LISDEXAMFETAMINE DIMESYLATE 20 MG PO CAPS: 40.0000 mg | ORAL_CAPSULE | Freq: Every day | ORAL | Status: DC

## 2024-05-20 MED ORDER — PEG 3350-KCL-NA BICARB-NACL 420 G PO SOLR
4000.0000 mL | Freq: Once | ORAL | Status: AC
  Administered 2024-05-20: 19:00:00 4000 mL via ORAL
  Filled 2024-05-20: qty 4000

## 2024-05-20 MED ORDER — SODIUM CHLORIDE 0.9 % IV BOLUS
1000.0000 mL | Freq: Once | INTRAVENOUS | Status: AC
  Administered 2024-05-20: 01:00:00 1000 mL via INTRAVENOUS

## 2024-05-20 MED ORDER — ONDANSETRON HCL 4 MG/2ML IJ SOLN
4.0000 mg | Freq: Four times a day (QID) | INTRAMUSCULAR | Status: DC | PRN
  Administered 2024-05-20: 22:00:00 4 mg via INTRAVENOUS
  Filled 2024-05-20: qty 2

## 2024-05-20 MED ORDER — SODIUM CHLORIDE 0.9% IV SOLUTION: Freq: Once | INTRAVENOUS | Status: AC

## 2024-05-20 MED ORDER — PANTOPRAZOLE SODIUM 40 MG IV SOLR
40.0000 mg | Freq: Two times a day (BID) | INTRAVENOUS | Status: DC
  Administered 2024-05-20 – 2024-05-21 (×3): 40 mg via INTRAVENOUS
  Filled 2024-05-20 (×3): qty 10

## 2024-05-20 MED ORDER — SODIUM CHLORIDE 0.9 % IV SOLN: INTRAVENOUS | Status: AC

## 2024-05-20 MED ORDER — PANTOPRAZOLE SODIUM 40 MG IV SOLR
80.0000 mg | Freq: Once | INTRAVENOUS | Status: AC
  Administered 2024-05-20: 04:00:00 80 mg via INTRAVENOUS
  Filled 2024-05-20: qty 20

## 2024-05-20 MED ORDER — IOHEXOL 350 MG/ML SOLN
100.0000 mL | Freq: Once | INTRAVENOUS | Status: AC | PRN
  Administered 2024-05-20: 01:00:00 100 mL via INTRAVENOUS

## 2024-05-20 NOTE — Progress Notes (Signed)
 Cross covering ICU physician.    At this time pt is being transfused. 2 u prbc ordered.  BP remains adequate. No acute need for ICU admission. Please contact again should BP decrease.   Appreciate GI input and eval.

## 2024-05-20 NOTE — ED Notes (Signed)
 [facility] called for transport.

## 2024-05-20 NOTE — ED Provider Notes (Signed)
°  Physical Exam  BP (!) 97/58   Pulse 78   Temp 97.8 F (36.6 C) (Oral)   Resp 17   Ht 5' 8 (1.727 m)   Wt 63 kg   LMP 08/06/2011   SpO2 100%   BMI 21.13 kg/m   Physical Exam  Procedures  Procedures  ED Course / MDM    Medical Decision Making Amount and/or Complexity of Data Reviewed Labs: ordered. Radiology: ordered.  Risk Prescription drug management. Decision regarding hospitalization.   Patient pending admission to hospital.  Anemia with some hypotension.  Blood pressure 1 teens right now.  Not an ICU candidate.  Appears to be hospitalist admission.       Patsey Lot, MD 05/20/24 [phone]

## 2024-05-20 NOTE — Consult Note (Signed)
 Reason for Consult:Rectal bleeding with anemia. Referring Physician: THP.  Jillian Francis is an 53 y.o. female.  HPI: Jillian Francis is a 53 year old, white female, who presented to the ED with a history profuse rectal bleeding that started about 7 PM last night when straining to facilitate a BM. She had a history of chronic constipation for which she has been taking Trulance. She has been constipated recently and did not have a BM for 4 days before the onset of the hematochezia. She initially noted black stools mixed with fresh blood followed by a lot of BRBPR. She had some mild lower abdominal cramping yesterday. She felt faint with the bleeding but denies having any near syncope or syncope.She denis using any NSAIDS. Her last colonoscopy done in 2021 revealed pandiverticulosis. She was noted to have a 2 gms drop in her hemopglobin after admission but the rectal bleeding seems to have slowed down now. She was hypotensive on arrival in the ED with BP of 90/60 mm of Hg. She was actively resuscitated and also received a unit of PRBC's. CTA on admission revealed diverticulosis but no source of active GI bleeding was identified.  Her reflux symptoms are well controlled by Omeprazole.   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   Social History:  reports that she has never smoked.  She has never been exposed to tobacco smoke. She has never used smokeless tobacco. She reports current alcohol use of about 2.0 standard drinks of alcohol per week. She reports that she does not use drugs.  Allergies: Allergies[1]  Medications: I have reviewed the patient's current medications. Prior to Admission: (Not in a hospital admission)  Scheduled:  lisdexamfetamine   40 mg Oral Daily   pantoprazole  (PROTONIX ) IV  40 mg Intravenous Q12H   Continuous:  sodium chloride  100 mL/hr at 05/20/24 1030   PRN:ondansetron  (ZOFRAN ) IV Anti-infectives (From admission, onward)    None       Results for orders placed or performed during the hospital encounter of 05/19/24 (from the past 48 hours)  Comprehensive metabolic panel     Status: Abnormal   Collection Time: 05/19/24  9:35 PM  Result Value Ref Range   Sodium 141 135 - 145 mmol/L   Potassium 3.9 3.5 - 5.1 mmol/L   Chloride 105 98 - 111 mmol/L   CO2 24 22 - 32 mmol/L   Glucose, Bld 86 70 - 99 mg/dL    Comment: Glucose reference range applies only to samples taken after fasting for at least 8 hours.   BUN 30 (H) 6 - 20 mg/dL   Creatinine, Ser 9.26 0.44 - 1.00 mg/dL   Calcium 9.1 8.9 - 89.6 mg/dL   Total Protein 6.7 6.5 -  8.1 g/dL   Albumin 4.3 3.5 - 5.0 g/dL   AST 20 15 - 41 U/L   ALT 17 0 - 44 U/L   Alkaline Phosphatase 71 38 - 126 U/L   Total Bilirubin 0.5 0.0 - 1.2 mg/dL   GFR, Estimated >39 >39 mL/min    Comment: (NOTE) Calculated using the CKD-EPI Creatinine Equation (2021)    Anion gap 13 5 - 15    Comment: Performed at [facility], [address] [place], [place] [address]  CBC     Status: Abnormal   Collection Time: 05/19/24  9:35 PM  Result Value Ref Range   WBC 6.0 4.0 - 10.5 K/uL   RBC 3.62 (L) 3.87 - 5.11 MIL/uL   Hemoglobin 10.8 (L) 12.0 - 15.0 g/dL   HCT 66.8 (L) 63.9 - 53.9 %   MCV 91.4 80.0 - 100.0 fL   MCH 29.8 26.0 - 34.0 pg   MCHC 32.6 30.0 - 36.0 g/dL   RDW 85.9 88.4 - 84.4 %    Platelets 310 150 - 400 K/uL   nRBC 0.0 0.0 - 0.2 %    Comment: Performed at [facility], [address] [place], [place] [address]  Hemoglobin and hematocrit, blood     Status: Abnormal   Collection Time: 05/20/24 12:51 AM  Result Value Ref Range   Hemoglobin 8.4 (L) 12.0 - 15.0 g/dL   HCT 73.9 (L) 63.9 - 53.9 %    Comment: Performed at [facility], [address] [place], [place] [address]  ABO/Rh     Status: None   Collection Time: 05/20/24 12:53 AM  Result Value Ref Range   ABO/RH(D)      O POS Performed at [facility], [address] [place], [place] [address]   Prepare RBC (crossmatch)     Status: None   Collection Time: 05/20/24  2:45 AM  Result Value Ref Range   Order Confirmation      ORDER PROCESSED BY BLOOD BANK Performed at [facility], [address] [place], [place] [address]   Type and screen [facility]     Status: None (Preliminary result)   Collection Time: 05/20/24  2:48 AM  Result Value Ref Range   ABO/RH(D) O POS    Antibody Screen NEG    Sample Expiration 05/23/2024,2359    Unit Number [id number]    Blood Component Type RED CELLS,LR    Unit division 00    Status of Unit ISSUED    Transfusion Status OK TO TRANSFUSE    Crossmatch Result      Compatible Performed at [facility], [address], [place], [place] [address]     CT ANGIO GI BLEED Result Date: 05/20/2024 EXAM: CTA ABDOMEN AND PELVIS WITH CONTRAST 05/20/2024 01:12:55 AM TECHNIQUE: CTA images of the abdomen and pelvis with intravenous contrast. Three-dimensional MIP/volume rendered formations were performed. Automated exposure control, iterative reconstruction, and/or weight based adjustment of the mA/kV was utilized to reduce the radiation dose to as low as reasonably achievable. COMPARISON: 03/22/2024 CLINICAL HISTORY: Bony stools. FINDINGS: VASCULATURE: GI BLEED: No evidence of active extravasation or pooling of contrast  material is noted to suggest active GI bleeding. AORTA: The abdominal aorta is within normal limits. No acute finding. No abdominal aortic aneurysm. No dissection. CELIAC TRUNK: No acute finding. No occlusion or significant stenosis. SUPERIOR MESENTERIC ARTERY: No acute finding. No occlusion or significant  stenosis. INFERIOR MESENTERIC ARTERY: No acute finding. No occlusion or significant stenosis. RENAL ARTERIES: No acute finding. No occlusion or significant stenosis. ILIAC ARTERIES: No acute finding. No occlusion or significant stenosis. ABDOMEN/PELVIS: LOWER CHEST: Visualized portion of the lower chest demonstrates no acute abnormality. LIVER: The liver is unremarkable. GALLBLADDER AND BILE DUCTS: Gallbladder is unremarkable. No biliary ductal dilatation. SPLEEN: The spleen is unremarkable. PANCREAS: The pancreas is unremarkable. ADRENAL GLANDS: Bilateral adrenal glands demonstrate no acute abnormality. KIDNEYS, URETERS AND BLADDER: Scattered cysts are noted within the right kidney. No follow-up is recommended. No obstructive changes are seen. No stones in the kidneys or ureters. No hydronephrosis. The bladder is partially distended. GI AND BOWEL: Stomach and duodenal sweep demonstrate no acute abnormality. Small bowel and stomach are within normal limits. Diverticular change of the colon is noted. There is no bowel obstruction. No abnormal bowel wall thickening or distension. REPRODUCTIVE: The uterus has been surgically removed. PERITONEUM AND RETROPERITONEUM: No ascites or free air. LYMPH NODES: No lymphadenopathy. BONES AND SOFT TISSUES: No acute abnormality of the bones. No acute soft tissue abnormality. IMPRESSION: 1. No active GI bleeding. 2. Diverticulosis without diverticulitis Electronically signed by: Oneil Devonshire MD 05/20/2024 01:36 AM EST RP Workstation: [id number]   Review of Systems  Constitutional:  Positive for activity change and fatigue. Negative for appetite change, chills, diaphoresis, fever  and unexpected weight change.  HENT: Negative.    Eyes: Negative.   Respiratory: Negative.    Cardiovascular: Negative.   Gastrointestinal:  Positive for anal bleeding, blood in stool and constipation. Negative for abdominal distention, abdominal pain, diarrhea and nausea.  Endocrine: Negative.   Genitourinary: Negative.   Musculoskeletal: Negative.   Skin:  Negative for color change, pallor, rash and wound.  Allergic/Immunologic: Negative.   Neurological:  Positive for dizziness, weakness, light-headedness and headaches. Negative for seizures, syncope, facial asymmetry, speech difficulty and numbness.  Hematological: Negative.   Psychiatric/Behavioral: Negative.     Blood pressure (!) 97/58, pulse 78, temperature 97.8 F (36.6 C), temperature source Oral, resp. rate 17, height 5' 8 (1.727 m), weight 63 kg, last menstrual period 08/06/2011, SpO2 100%. Physical Exam Constitutional:      General: She is not in acute distress.    Appearance: Normal appearance. She is not ill-appearing, toxic-appearing or diaphoretic.  HENT:     Head: Normocephalic and atraumatic.     Mouth/Throat:     Mouth: Mucous membranes are moist.  Eyes:     Extraocular Movements: Extraocular movements intact.     Pupils: Pupils are equal, round, and reactive to light.  Cardiovascular:     Rate and Rhythm: Normal rate and regular rhythm.     Pulses: Normal pulses.     Heart sounds: Normal heart sounds.  Abdominal:     General: Bowel sounds are normal. There is no distension.     Palpations: Abdomen is soft.     Tenderness: There is no abdominal tenderness. There is no guarding.  Musculoskeletal:     Cervical back: Normal range of motion and neck supple.  Skin:    General: Skin is warm and dry.  Neurological:     General: No focal deficit present.     Mental Status: She is alert and oriented to person, place, and time.  Psychiatric:        Mood and Affect: Mood normal.        Behavior: Behavior normal.         Thought Content: Thought content normal.  Judgment: Judgment normal.   Assessment/Plan: 1) Acute blood loss anemia with melena and hematochezia-pandiverticulosis on colonoscopy in 2021-I suspect she has a diverticular bleed; however as she presented with oth melena nd hematochezia, plans are to proceed with an EGD/Colonoscopy tomorrow.  2) Chronic constipation-on Trulance. ) ADHD on Vyvanse .  Seiya Silsby 05/20/2024, 8:16 AM         [1]  Allergies Allergen Reactions   Latex     Other reaction(s): Unknown   Shellfish Allergy     Shrimp (Diagnostic) Other (See Comments)    Throat swelling, facial redness   Wound Dressing Adhesive     Other reaction(s): Unknown   Morphine And Codeine Hives and Rash    Patient tolerates oxycodone 

## 2024-05-20 NOTE — H&P (View-Only) (Signed)
 Reason for Consult:Rectal bleeding with anemia. Referring Physician: THP.  Jillian Francis is an 53 y.o. female.  HPI: Jillian Francis is a 53 year old, white female, who presented to the ED with a history profuse rectal bleeding that started about 7 PM last night when straining to facilitate a BM. She had a history of chronic constipation for which she has been taking Trulance. She has been constipated recently and did not have a BM for 4 days before the onset of the hematochezia. She initially noted black stools mixed with fresh blood followed by a lot of BRBPR. She had some mild lower abdominal cramping yesterday. She felt faint with the bleeding but denies having any near syncope or syncope.She denis using any NSAIDS. Her last colonoscopy done in 2021 revealed pandiverticulosis. She was noted to have a 2 gms drop in her hemopglobin after admission but the rectal bleeding seems to have slowed down now. She was hypotensive on arrival in the ED with BP of 90/60 mm of Hg. She was actively resuscitated and also received a unit of PRBC's. CTA on admission revealed diverticulosis but no source of active GI bleeding was identified.  Her reflux symptoms are well controlled by Omeprazole.   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   Social History:  reports that she has never smoked.  She has never been exposed to tobacco smoke. She has never used smokeless tobacco. She reports current alcohol use of about 2.0 standard drinks of alcohol per week. She reports that she does not use drugs.  Allergies: Allergies[1]  Medications: I have reviewed the patient's current medications. Prior to Admission: (Not in a hospital admission)  Scheduled:  lisdexamfetamine   40 mg Oral Daily   pantoprazole  (PROTONIX ) IV  40 mg Intravenous Q12H   Continuous:  sodium chloride  100 mL/hr at 05/20/24 1030   PRN:ondansetron  (ZOFRAN ) IV Anti-infectives (From admission, onward)    None       Results for orders placed or performed during the hospital encounter of 05/19/24 (from the past 48 hours)  Comprehensive metabolic panel     Status: Abnormal   Collection Time: 05/19/24  9:35 PM  Result Value Ref Range   Sodium 141 135 - 145 mmol/L   Potassium 3.9 3.5 - 5.1 mmol/L   Chloride 105 98 - 111 mmol/L   CO2 24 22 - 32 mmol/L   Glucose, Bld 86 70 - 99 mg/dL    Comment: Glucose reference range applies only to samples taken after fasting for at least 8 hours.   BUN 30 (H) 6 - 20 mg/dL   Creatinine, Ser 9.26 0.44 - 1.00 mg/dL   Calcium 9.1 8.9 - 89.6 mg/dL   Total Protein 6.7 6.5 -  8.1 g/dL   Albumin 4.3 3.5 - 5.0 g/dL   AST 20 15 - 41 U/L   ALT 17 0 - 44 U/L   Alkaline Phosphatase 71 38 - 126 U/L   Total Bilirubin 0.5 0.0 - 1.2 mg/dL   GFR, Estimated >39 >39 mL/min    Comment: (NOTE) Calculated using the CKD-EPI Creatinine Equation (2021)    Anion gap 13 5 - 15    Comment: Performed at [facility], [address] [place], [place] [address]  CBC     Status: Abnormal   Collection Time: 05/19/24  9:35 PM  Result Value Ref Range   WBC 6.0 4.0 - 10.5 K/uL   RBC 3.62 (L) 3.87 - 5.11 MIL/uL   Hemoglobin 10.8 (L) 12.0 - 15.0 g/dL   HCT 66.8 (L) 63.9 - 53.9 %   MCV 91.4 80.0 - 100.0 fL   MCH 29.8 26.0 - 34.0 pg   MCHC 32.6 30.0 - 36.0 g/dL   RDW 85.9 88.4 - 84.4 %    Platelets 310 150 - 400 K/uL   nRBC 0.0 0.0 - 0.2 %    Comment: Performed at [facility], [address] [place], [place] [address]  Hemoglobin and hematocrit, blood     Status: Abnormal   Collection Time: 05/20/24 12:51 AM  Result Value Ref Range   Hemoglobin 8.4 (L) 12.0 - 15.0 g/dL   HCT 73.9 (L) 63.9 - 53.9 %    Comment: Performed at [facility], [address] [place], [place] [address]  ABO/Rh     Status: None   Collection Time: 05/20/24 12:53 AM  Result Value Ref Range   ABO/RH(D)      O POS Performed at [facility], [address] [place], [place] [address]   Prepare RBC (crossmatch)     Status: None   Collection Time: 05/20/24  2:45 AM  Result Value Ref Range   Order Confirmation      ORDER PROCESSED BY BLOOD BANK Performed at [facility], [address] [place], [place] [address]   Type and screen [facility]     Status: None (Preliminary result)   Collection Time: 05/20/24  2:48 AM  Result Value Ref Range   ABO/RH(D) O POS    Antibody Screen NEG    Sample Expiration 05/23/2024,2359    Unit Number [id number]    Blood Component Type RED CELLS,LR    Unit division 00    Status of Unit ISSUED    Transfusion Status OK TO TRANSFUSE    Crossmatch Result      Compatible Performed at [facility], [address], [place], [place] [address]     CT ANGIO GI BLEED Result Date: 05/20/2024 EXAM: CTA ABDOMEN AND PELVIS WITH CONTRAST 05/20/2024 01:12:55 AM TECHNIQUE: CTA images of the abdomen and pelvis with intravenous contrast. Three-dimensional MIP/volume rendered formations were performed. Automated exposure control, iterative reconstruction, and/or weight based adjustment of the mA/kV was utilized to reduce the radiation dose to as low as reasonably achievable. COMPARISON: 03/22/2024 CLINICAL HISTORY: Bony stools. FINDINGS: VASCULATURE: GI BLEED: No evidence of active extravasation or pooling of contrast  material is noted to suggest active GI bleeding. AORTA: The abdominal aorta is within normal limits. No acute finding. No abdominal aortic aneurysm. No dissection. CELIAC TRUNK: No acute finding. No occlusion or significant stenosis. SUPERIOR MESENTERIC ARTERY: No acute finding. No occlusion or significant  stenosis. INFERIOR MESENTERIC ARTERY: No acute finding. No occlusion or significant stenosis. RENAL ARTERIES: No acute finding. No occlusion or significant stenosis. ILIAC ARTERIES: No acute finding. No occlusion or significant stenosis. ABDOMEN/PELVIS: LOWER CHEST: Visualized portion of the lower chest demonstrates no acute abnormality. LIVER: The liver is unremarkable. GALLBLADDER AND BILE DUCTS: Gallbladder is unremarkable. No biliary ductal dilatation. SPLEEN: The spleen is unremarkable. PANCREAS: The pancreas is unremarkable. ADRENAL GLANDS: Bilateral adrenal glands demonstrate no acute abnormality. KIDNEYS, URETERS AND BLADDER: Scattered cysts are noted within the right kidney. No follow-up is recommended. No obstructive changes are seen. No stones in the kidneys or ureters. No hydronephrosis. The bladder is partially distended. GI AND BOWEL: Stomach and duodenal sweep demonstrate no acute abnormality. Small bowel and stomach are within normal limits. Diverticular change of the colon is noted. There is no bowel obstruction. No abnormal bowel wall thickening or distension. REPRODUCTIVE: The uterus has been surgically removed. PERITONEUM AND RETROPERITONEUM: No ascites or free air. LYMPH NODES: No lymphadenopathy. BONES AND SOFT TISSUES: No acute abnormality of the bones. No acute soft tissue abnormality. IMPRESSION: 1. No active GI bleeding. 2. Diverticulosis without diverticulitis Electronically signed by: Oneil Devonshire MD 05/20/2024 01:36 AM EST RP Workstation: [id number]   Review of Systems  Constitutional:  Positive for activity change and fatigue. Negative for appetite change, chills, diaphoresis, fever  and unexpected weight change.  HENT: Negative.    Eyes: Negative.   Respiratory: Negative.    Cardiovascular: Negative.   Gastrointestinal:  Positive for anal bleeding, blood in stool and constipation. Negative for abdominal distention, abdominal pain, diarrhea and nausea.  Endocrine: Negative.   Genitourinary: Negative.   Musculoskeletal: Negative.   Skin:  Negative for color change, pallor, rash and wound.  Allergic/Immunologic: Negative.   Neurological:  Positive for dizziness, weakness, light-headedness and headaches. Negative for seizures, syncope, facial asymmetry, speech difficulty and numbness.  Hematological: Negative.   Psychiatric/Behavioral: Negative.     Blood pressure (!) 97/58, pulse 78, temperature 97.8 F (36.6 C), temperature source Oral, resp. rate 17, height 5' 8 (1.727 m), weight 63 kg, last menstrual period 08/06/2011, SpO2 100%. Physical Exam Constitutional:      General: She is not in acute distress.    Appearance: Normal appearance. She is not ill-appearing, toxic-appearing or diaphoretic.  HENT:     Head: Normocephalic and atraumatic.     Mouth/Throat:     Mouth: Mucous membranes are moist.  Eyes:     Extraocular Movements: Extraocular movements intact.     Pupils: Pupils are equal, round, and reactive to light.  Cardiovascular:     Rate and Rhythm: Normal rate and regular rhythm.     Pulses: Normal pulses.     Heart sounds: Normal heart sounds.  Abdominal:     General: Bowel sounds are normal. There is no distension.     Palpations: Abdomen is soft.     Tenderness: There is no abdominal tenderness. There is no guarding.  Musculoskeletal:     Cervical back: Normal range of motion and neck supple.  Skin:    General: Skin is warm and dry.  Neurological:     General: No focal deficit present.     Mental Status: She is alert and oriented to person, place, and time.  Psychiatric:        Mood and Affect: Mood normal.        Behavior: Behavior normal.         Thought Content: Thought content normal.  Judgment: Judgment normal.   Assessment/Plan: 1) Acute blood loss anemia with melena and hematochezia-pandiverticulosis on colonoscopy in 2021-I suspect she has a diverticular bleed; however as she presented with oth melena nd hematochezia, plans are to proceed with an EGD/Colonoscopy tomorrow.  2) Chronic constipation-on Trulance. ) ADHD on Vyvanse .  Seiya Silsby 05/20/2024, 8:16 AM         [1]  Allergies Allergen Reactions   Latex     Other reaction(s): Unknown   Shellfish Allergy     Shrimp (Diagnostic) Other (See Comments)    Throat swelling, facial redness   Wound Dressing Adhesive     Other reaction(s): Unknown   Morphine And Codeine Hives and Rash    Patient tolerates oxycodone 

## 2024-05-20 NOTE — ED Notes (Signed)
 RN notified Dr. Georgina if he would like a H&H since pt transfusion stopped at 07:15

## 2024-05-20 NOTE — ED Notes (Signed)
 Pt arrives to ED via [facility].

## 2024-05-20 NOTE — ED Notes (Signed)
 Pt ambulated to restroom with RN without assistance.

## 2024-05-20 NOTE — ED Provider Notes (Signed)
 [place] EMERGENCY DEPARTMENT AT [facility] Provider Note   CSN: [id number] Arrival date & time: 05/19/24  2107     Patient presents with: Rectal Bleeding   Jillian Francis is a 53 y.o. female.   53 year old female presents for evaluation of rectal bleeding.  States that started today around 5 PM has been profuse.  States she is lightheaded specially when she tries to get up she feels like she is going to faint.  She is not on blood thinners and has no history of GI bleed.  Was seen at outside ER and GI was consulted recommended she come here for further workup and management.  Patient's hemoglobin noted to have dropped 2 points in 2 hours, blood pressure is soft and CTA is negative for active bleeding.   Rectal Bleeding Associated symptoms: light-headedness   Associated symptoms: no abdominal pain, no fever and no vomiting        Prior to Admission medications  Medication Sig Start Date End Date Taking? Authorizing Provider  azelastine  (ASTELIN ) 0.1 % nasal spray Place 1 spray into both nostrils 2 (two) times daily as needed. 04/22/24   Lorin Norris, MD  Azelastine -Fluticasone  137-50 MCG/ACT SUSP PLACE 1 SPRAY INTO THE NOSE IN THE MORNING AND AT BEDTIME. 04/13/24   Lorin Norris, MD  diphenhydrAMINE  (BENADRYL ) 50 MG tablet Take one tablet (50 mg) by mouth 1 hour prior to contrast. 03/17/24   Amundson C Silva, Brook E, MD  EPINEPHrine  (AUVI-Q ) 0.3 mg/0.3 mL IJ SOAJ injection Inject 0.3 mg into the muscle as needed for anaphylaxis. 03/20/23   Lorin Norris, MD  Fezolinetant  (VEOZAH ) 45 MG TABS Take 1 tablet (45 mg total) by mouth daily. 03/03/24   Chrzanowski, Jami B, NP  finasteride (PROPECIA) 1 MG tablet Take 1 mg by mouth daily. 06/26/23   [provider]  fluconazole  (DIFLUCAN ) 150 MG tablet TAKE 1 TABLET BY MOUTH EVERY 3 DAYS. 05/18/23   [provider]  fluconazole  (DIFLUCAN ) 200 MG tablet Take 1 tablet (200 mg total) by mouth daily as  needed for up to 2 doses. 03/22/24   Cottie Donnice JINNY, MD  fluticasone  (FLONASE ) 50 MCG/ACT nasal spray Place 2 sprays into both nostrils daily. 04/22/24   Lorin Norris, MD  gabapentin (NEURONTIN) 100 MG capsule Take 100 mg by mouth 2 (two) times daily. 11/15/23 11/14/24  [provider]  lisdexamfetamine  (VYVANSE ) 40 MG capsule Take 40 mg by mouth daily.    [provider]  Magnesium Gluconate (MAGNESIUM 27 PO) Magnesium    [provider]  Misc Natural Products (JOINT SUPPORT COMPLEX PO) Joint Support    [provider]  Multiple Vitamins-Minerals (HAIR SKIN AND NAILS FORMULA PO) HAIR SKIN AND NAILS    [provider]  nitrofurantoin , macrocrystal-monohydrate, (MACROBID ) 100 MG capsule TAKE ONE CAPSULE BY MOUTH WITH INTERCOURSE. 05/06/24   Prentiss Annabella LABOR, NP  NON [facility]    [provider]  NON FORMULARY menopause vitamins    [provider]  olopatadine (PATANOL) 0.1 % ophthalmic solution Place 2 drops into both eyes at bedtime.    [provider]  Omeprazole-Sodium Bicarbonate (ZEGERID PO) Take by mouth.    [provider]  ondansetron  (ZOFRAN -ODT) 4 MG disintegrating tablet Take 1 tablet (4 mg total) by mouth every 8 (eight) hours as needed for up to 12 doses for nausea or vomiting. 03/22/24   Cottie Donnice JINNY, MD  ORLISTAT PO Take by mouth.    [provider]  predniSONE  (DELTASONE ) 50 MG tablet Take one tablet (50 mg) by mouth 13 hours prior to contrast.  Take one tablet (50 mg) by mouth 7 hours prior to contrast.  Take one tablet (50 mg) by mouth 1 hour prior to contrast. 03/17/24   Amundson C Silva, Brook E, MD  Probiotic Product (PROBIOTIC PO) Take by mouth as needed.     [provider]  tretinoin (ALTRALIN) 0.05 % gel Apply topically daily. 02/25/24   [provider]  TRULANCE 3 MG TABS 1 tab(s) orally once a day for 90 days 09/26/23   [provider]     Allergies: Latex, Shellfish allergy , Shrimp (diagnostic), Wound dressing adhesive, and Morphine and codeine    Review of Systems  Constitutional:  Positive for fatigue. Negative for chills and fever.  HENT:  Negative for ear pain and sore throat.   Eyes:  Negative for pain and visual disturbance.  Respiratory:  Negative for cough and shortness of breath.   Cardiovascular:  Negative for chest pain and palpitations.  Gastrointestinal:  Positive for blood in stool and hematochezia. Negative for abdominal pain and vomiting.  Genitourinary:  Negative for dysuria and hematuria.  Musculoskeletal:  Negative for arthralgias and back pain.  Skin:  Negative for color change and rash.  Neurological:  Positive for light-headedness. Negative for seizures and syncope.  All other systems reviewed and are negative.   Updated Vital Signs BP (!) 92/59   Pulse 89   Temp 98.1 F (36.7 C) (Oral)   Resp 15   Ht 5' 8 (1.727 m)   Wt 63 kg   LMP 08/06/2011   SpO2 100%   BMI 21.13 kg/m   Physical Exam Vitals and nursing note reviewed.  Constitutional:      General: She is not in acute distress.    Appearance: Normal appearance. She is well-developed. She is not ill-appearing.  HENT:     Head: Normocephalic and atraumatic.  Eyes:     Conjunctiva/sclera: Conjunctivae normal.  Cardiovascular:     Rate and Rhythm: Regular rhythm. Tachycardia present.     Heart sounds: No murmur heard. Pulmonary:     Effort: Pulmonary effort is normal. No respiratory distress.     Breath sounds: Normal breath sounds.  Abdominal:     Palpations: Abdomen is soft.     Tenderness: There is no abdominal tenderness.  Musculoskeletal:        General: No swelling.     Cervical back: Neck supple.  Skin:    General: Skin is warm and dry.     Capillary Refill: Capillary refill takes less than 2 seconds.  Neurological:     Mental Status: She is alert.  Psychiatric:        Mood and Affect: Mood normal.     (all  labs ordered are listed, but only abnormal results are displayed) Labs Reviewed  COMPREHENSIVE METABOLIC PANEL WITH GFR - Abnormal; Notable for the following components:      Result Value   BUN 30 (*)    All other components within normal limits  CBC - Abnormal; Notable for the following components:   RBC 3.62 (*)    Hemoglobin 10.8 (*)    HCT 33.1 (*)    All other components within normal limits  HEMOGLOBIN AND HEMATOCRIT, BLOOD - Abnormal; Notable for the following components:   Hemoglobin 8.4 (*)    HCT 26.0 (*)    All other components within normal limits  ABO/RH  PREPARE RBC (  CROSSMATCH)  TYPE AND SCREEN    EKG: EKG Interpretation Date/Time:  Wednesday May 20 2024 00:43:38 EST Ventricular Rate:  86 PR Interval:  59 QRS Duration:  120 QT Interval:  433 QTC Calculation: 515 R Axis:   80  Text Interpretation: Sinus rhythm Ventricular premature complex Short PR interval Right atrial enlargement Nonspecific intraventricular conduction delay Artifact in lead(s) I II III aVR aVL aVF V1 V4 V5 V6 No previous for comparison Confirmed by Gennaro Bouchard ([id number]) on 05/20/2024 2:20:48 AM  Radiology: CT ANGIO GI BLEED Result Date: 05/20/2024 EXAM: CTA ABDOMEN AND PELVIS WITH CONTRAST 05/20/2024 01:12:55 AM TECHNIQUE: CTA images of the abdomen and pelvis with intravenous contrast. Three-dimensional MIP/volume rendered formations were performed. Automated exposure control, iterative reconstruction, and/or weight based adjustment of the mA/kV was utilized to reduce the radiation dose to as low as reasonably achievable. COMPARISON: 03/22/2024 CLINICAL HISTORY: Bony stools. FINDINGS: VASCULATURE: GI BLEED: No evidence of active extravasation or pooling of contrast material is noted to suggest active GI bleeding. AORTA: The abdominal aorta is within normal limits. No acute finding. No abdominal aortic aneurysm. No dissection. CELIAC TRUNK: No acute finding. No occlusion or significant  stenosis. SUPERIOR MESENTERIC ARTERY: No acute finding. No occlusion or significant stenosis. INFERIOR MESENTERIC ARTERY: No acute finding. No occlusion or significant stenosis. RENAL ARTERIES: No acute finding. No occlusion or significant stenosis. ILIAC ARTERIES: No acute finding. No occlusion or significant stenosis. ABDOMEN/PELVIS: LOWER CHEST: Visualized portion of the lower chest demonstrates no acute abnormality. LIVER: The liver is unremarkable. GALLBLADDER AND BILE DUCTS: Gallbladder is unremarkable. No biliary ductal dilatation. SPLEEN: The spleen is unremarkable. PANCREAS: The pancreas is unremarkable. ADRENAL GLANDS: Bilateral adrenal glands demonstrate no acute abnormality. KIDNEYS, URETERS AND BLADDER: Scattered cysts are noted within the right kidney. No follow-up is recommended. No obstructive changes are seen. No stones in the kidneys or ureters. No hydronephrosis. The bladder is partially distended. GI AND BOWEL: Stomach and duodenal sweep demonstrate no acute abnormality. Small bowel and stomach are within normal limits. Diverticular change of the colon is noted. There is no bowel obstruction. No abnormal bowel wall thickening or distension. REPRODUCTIVE: The uterus has been surgically removed. PERITONEUM AND RETROPERITONEUM: No ascites or free air. LYMPH NODES: No lymphadenopathy. BONES AND SOFT TISSUES: No acute abnormality of the bones. No acute soft tissue abnormality. IMPRESSION: 1. No active GI bleeding. 2. Diverticulosis without diverticulitis Electronically signed by: Oneil Devonshire MD 05/20/2024 01:36 AM EST RP Workstation: [id number]     .Critical Care  Performed by: Gennaro Bouchard CROME, DO Authorized by: Gennaro Bouchard CROME, DO   Critical care provider statement:    Critical care time (minutes):  45   Critical care time was exclusive of:  Separately billable procedures and treating other patients and teaching time   Critical care was necessary to treat or prevent imminent or  life-threatening deterioration of the following conditions:  Shock and circulatory failure   Critical care was time spent personally by me on the following activities:  Development of treatment plan with patient or surrogate, discussions with consultants, evaluation of patient's response to treatment, examination of patient, ordering and review of laboratory studies, ordering and review of radiographic studies, ordering and performing treatments and interventions, pulse oximetry, re-evaluation of patient's condition and review of old charts   Care discussed with: admitting provider      Medications Ordered in the ED  ondansetron  (ZOFRAN ) injection 4 mg (0 mg Intravenous Hold 05/20/24 0226)  0.9 %  sodium chloride  infusion (  Manually program via Guardrails IV Fluids) (has no administration in time range)  sodium chloride  0.9 % bolus 1,000 mL (0 mLs Intravenous Stopped 05/20/24 0414)  sodium chloride  0.9 % bolus 1,000 mL (0 mLs Intravenous Stopped 05/20/24 0448)  iohexol  (OMNIPAQUE ) 350 MG/ML injection 100 mL (100 mLs Intravenous Contrast Given 05/20/24 0102)  pantoprazole  (PROTONIX ) injection 80 mg (80 mg Intravenous Given 05/20/24 0354)                                    Medical Decision Making Cardiac monitor interpretation: Sinus rhythm to sinus tachycardia, no ectopy  Patient sent here for acute rectal bleeding.  On arrival blood pressure was 100 systolic and she is only minimally tachycardic.  Not having any active bleeding.  I discussed patient's case with Dr. Rollin, GI who is aware of the patient.  On her repeat hemoglobin it has dropped 2 points since the previous about 2 hours ago and I will transfuse her a unit of blood at this time.  He agrees with this plan and recommended Protonix .  Blood pressure dropped to the 80s at 1 point hospitalist felt the patient will be better suited in the ICU.  I spoke with ICU attending and she will plan to evaluate the patient.  Patient did have some  improvement in her blood pressures after starting blood.  She feels comfortable plan for admission.  Problems Addressed: Acute blood loss anemia: acute illness or injury that poses a threat to life or bodily functions Acute GI bleeding: acute illness or injury that poses a threat to life or bodily functions  Amount and/or Complexity of Data Reviewed External Data Reviewed: notes.    Details: Prior ED records reviewed and patient seen 03-22-2024 for diverticulitis Labs: ordered. Decision-making details documented in ED Course.    Details: Ordered and reviewed by me and hemoglobin has dropped 2 points in 2 hours Radiology: ordered and independent interpretation performed. Decision-making details documented in ED Course.    Details: Ordered and interpreted by me independently of radiology CT angiogram abdomen shows no active bleeding Discussion of management or test interpretation with external provider(s): Dr. Rollin -GI-I spoke with her on phone regarding his patient's case and he recommended Protonix  and starting a unit of blood  Dr. Franky - hospitalist -I spoke with him on the phone regarding patient's case and he recommended patient go to the ICU  ICU -I spoke with ICU provider and she will plan to evaluate the patient  Risk OTC drugs. Prescription drug management. Drug therapy requiring intensive monitoring for toxicity. Decision regarding hospitalization. Risk Details: CRITICAL CARE Performed by: Duwaine LITTIE Fusi   Total critical care time: 45 minutes  Critical care time was exclusive of separately billable procedures and treating other patients.  Critical care was necessary to treat or prevent imminent or life-threatening deterioration.  Critical care was time spent personally by me on the following activities: development of treatment plan with patient and/or surrogate as well as nursing, discussions with consultants, evaluation of patient's response to treatment,  examination of patient, obtaining history from patient or surrogate, ordering and performing treatments and interventions, ordering and review of laboratory studies, ordering and review of radiographic studies, pulse oximetry and re-evaluation of patient's condition.   Critical Care Total time providing critical care: 45 minutes     Final diagnoses:  Acute GI bleeding  Acute blood loss anemia    ED Discharge  Orders     None          Gennaro Duwaine CROME, DO 05/20/24 [phone]

## 2024-05-20 NOTE — ED Notes (Signed)
 Patient transported to CT

## 2024-05-20 NOTE — ED Provider Notes (Signed)
 [place] [facility] AT [facility] Provider Note   CSN: [id number] Arrival date & time: 05/19/24  2107     Patient presents with: Rectal Bleeding   Jillian Francis is a 53 y.o. female.   The history is provided by the patient.  Rectal Bleeding Jillian Francis is a 53 y.o. female who presents to the Emergency Department complaining of hematochezia.  She presents to the emergency department for evaluation of 8 episodes of bright red blood per rectum that started at 7 PM.  Previously she had not gone to the bathroom for several days.  She is having associated abdominal cramping.  No fever.  At time of ED arrival she developed some nausea.  No history of prior GI bleed.  She does have a history of diverticulitis, colonic polyps.  She sees Dr. Kristie for GI.  She is not on any anticoagulation.  She does not take aspirin or Goody powders.  Orally medication is Trulance.       Prior to Admission medications  Medication Sig Start Date End Date Taking? Authorizing Provider  azelastine  (ASTELIN ) 0.1 % nasal spray Place 1 spray into both nostrils 2 (two) times daily as needed. 04/22/24   Lorin Norris, MD  Azelastine -Fluticasone  137-50 MCG/ACT SUSP PLACE 1 SPRAY INTO THE NOSE IN THE MORNING AND AT BEDTIME. 04/13/24   Lorin Norris, MD  diphenhydrAMINE  (BENADRYL ) 50 MG tablet Take one tablet (50 mg) by mouth 1 hour prior to contrast. 03/17/24   Cathlyn JAYSON Nikki Bobie FORBES, MD  EPINEPHrine  (AUVI-Q ) 0.3 mg/0.3 mL IJ SOAJ injection Inject 0.3 mg into the muscle as needed for anaphylaxis. 03/20/23   Lorin Norris, MD  Fezolinetant  (VEOZAH ) 45 MG TABS Take 1 tablet (45 mg total) by mouth daily. 03/03/24   Chrzanowski, Jami B, NP  finasteride (PROPECIA) 1 MG tablet Take 1 mg by mouth daily. 06/26/23   [provider]  fluconazole  (DIFLUCAN ) 150 MG tablet TAKE 1 TABLET BY MOUTH EVERY 3 DAYS. 05/18/23   [provider]  fluconazole  (DIFLUCAN ) 200 MG tablet  Take 1 tablet (200 mg total) by mouth daily as needed for up to 2 doses. 03/22/24   Cottie Donnice JINNY, MD  fluticasone  (FLONASE ) 50 MCG/ACT nasal spray Place 2 sprays into both nostrils daily. 04/22/24   Lorin Norris, MD  gabapentin (NEURONTIN) 100 MG capsule Take 100 mg by mouth 2 (two) times daily. 11/15/23 11/14/24  [provider]  lisdexamfetamine  (VYVANSE ) 40 MG capsule Take 40 mg by mouth daily.    [provider]  Magnesium Gluconate (MAGNESIUM 27 PO) Magnesium    [provider]  Misc Natural Products (JOINT SUPPORT COMPLEX PO) Joint Support    [provider]  Multiple Vitamins-Minerals (HAIR SKIN AND NAILS FORMULA PO) HAIR SKIN AND NAILS    [provider]  nitrofurantoin , macrocrystal-monohydrate, (MACROBID ) 100 MG capsule TAKE ONE CAPSULE BY MOUTH WITH INTERCOURSE. 05/06/24   Prentiss Annabella LABOR, NP  NON [facility]    [provider]  NON FORMULARY menopause vitamins    [provider]  olopatadine (PATANOL) 0.1 % ophthalmic solution Place 2 drops into both eyes at bedtime.    [provider]  Omeprazole-Sodium Bicarbonate (ZEGERID PO) Take by mouth.    [provider]  ondansetron  (ZOFRAN -ODT) 4 MG disintegrating tablet Take 1 tablet (4 mg total) by mouth every 8 (eight) hours as needed for up to 12 doses for nausea or vomiting. 03/22/24   Cottie Donnice JINNY, MD  ORLISTAT PO Take by mouth.    [provider]  predniSONE  (DELTASONE ) 50 MG tablet Take one tablet (50 mg) by mouth 13 hours prior to contrast.  Take one tablet (50 mg) by mouth 7 hours prior to contrast.  Take one tablet (50 mg) by mouth 1 hour prior to contrast. 03/17/24   Amundson C Silva, Brook E, MD  Probiotic Product (PROBIOTIC PO) Take by mouth as needed.     [provider]  tretinoin (ALTRALIN) 0.05 % gel Apply topically daily. 02/25/24   [provider]  TRULANCE 3 MG TABS 1 tab(s) orally once a day for  90 days 09/26/23   [provider]    Allergies: Latex, Shellfish allergy , Shrimp (diagnostic), Wound dressing adhesive, and Morphine and codeine    Review of Systems  Gastrointestinal:  Positive for hematochezia.  All other systems reviewed and are negative.   Updated Vital Signs BP (!) 97/58   Pulse 78   Temp 97.8 F (36.6 C) (Oral)   Resp 17   Ht 5' 8 (1.727 m)   Wt 63 kg   LMP 08/06/2011   SpO2 100%   BMI 21.13 kg/m   Physical Exam Vitals and nursing note reviewed.  Constitutional:      Appearance: She is well-developed.  HENT:     Head: Normocephalic and atraumatic.  Cardiovascular:     Rate and Rhythm: Normal rate and regular rhythm.     Heart sounds: No murmur heard. Pulmonary:     Effort: Pulmonary effort is normal. No respiratory distress.     Breath sounds: Normal breath sounds.  Abdominal:     Palpations: Abdomen is soft.     Tenderness: There is no abdominal tenderness. There is no guarding or rebound.  Genitourinary:    Comments: Nontender external hemorrhoids that are not actively bleeding. Musculoskeletal:        General: No tenderness.  Skin:    General: Skin is warm and dry.  Neurological:     Mental Status: She is alert and oriented to person, place, and time.  Psychiatric:        Behavior: Behavior normal.     (all labs ordered are listed, but only abnormal results are displayed) Labs Reviewed  COMPREHENSIVE METABOLIC PANEL WITH GFR - Abnormal; Notable for the following components:      Result Value   BUN 30 (*)    All other components within normal limits  CBC - Abnormal; Notable for the following components:   RBC 3.62 (*)    Hemoglobin 10.8 (*)    HCT 33.1 (*)    All other components within normal limits  HEMOGLOBIN AND HEMATOCRIT, BLOOD - Abnormal; Notable for the following components:   Hemoglobin 8.4 (*)    HCT 26.0 (*)    All other components within normal limits  ABO/RH  PREPARE RBC (CROSSMATCH)  TYPE AND SCREEN     EKG: EKG Interpretation Date/Time:  Wednesday May 20 2024 00:43:38 EST Ventricular Rate:  86 PR Interval:  59 QRS Duration:  120 QT Interval:  433 QTC Calculation: 515 R Axis:   80  Text Interpretation: Sinus rhythm Ventricular premature complex Short PR interval Right atrial enlargement Nonspecific intraventricular conduction delay Artifact in lead(s) I II III aVR aVL aVF V1 V4 V5 V6 No previous for comparison Confirmed by Gennaro Bouchard ([id number]) on 05/20/2024 2:20:48 AM  Radiology: CT ANGIO GI BLEED Result Date: 05/20/2024 EXAM: CTA ABDOMEN AND PELVIS WITH CONTRAST 05/20/2024 01:12:55 AM TECHNIQUE:  CTA images of the abdomen and pelvis with intravenous contrast. Three-dimensional MIP/volume rendered formations were performed. Automated exposure control, iterative reconstruction, and/or weight based adjustment of the mA/kV was utilized to reduce the radiation dose to as low as reasonably achievable. COMPARISON: 03/22/2024 CLINICAL HISTORY: Bony stools. FINDINGS: VASCULATURE: GI BLEED: No evidence of active extravasation or pooling of contrast material is noted to suggest active GI bleeding. AORTA: The abdominal aorta is within normal limits. No acute finding. No abdominal aortic aneurysm. No dissection. CELIAC TRUNK: No acute finding. No occlusion or significant stenosis. SUPERIOR MESENTERIC ARTERY: No acute finding. No occlusion or significant stenosis. INFERIOR MESENTERIC ARTERY: No acute finding. No occlusion or significant stenosis. RENAL ARTERIES: No acute finding. No occlusion or significant stenosis. ILIAC ARTERIES: No acute finding. No occlusion or significant stenosis. ABDOMEN/PELVIS: LOWER CHEST: Visualized portion of the lower chest demonstrates no acute abnormality. LIVER: The liver is unremarkable. GALLBLADDER AND BILE DUCTS: Gallbladder is unremarkable. No biliary ductal dilatation. SPLEEN: The spleen is unremarkable. PANCREAS: The pancreas is unremarkable. ADRENAL GLANDS:  Bilateral adrenal glands demonstrate no acute abnormality. KIDNEYS, URETERS AND BLADDER: Scattered cysts are noted within the right kidney. No follow-up is recommended. No obstructive changes are seen. No stones in the kidneys or ureters. No hydronephrosis. The bladder is partially distended. GI AND BOWEL: Stomach and duodenal sweep demonstrate no acute abnormality. Small bowel and stomach are within normal limits. Diverticular change of the colon is noted. There is no bowel obstruction. No abnormal bowel wall thickening or distension. REPRODUCTIVE: The uterus has been surgically removed. PERITONEUM AND RETROPERITONEUM: No ascites or free air. LYMPH NODES: No lymphadenopathy. BONES AND SOFT TISSUES: No acute abnormality of the bones. No acute soft tissue abnormality. IMPRESSION: 1. No active GI bleeding. 2. Diverticulosis without diverticulitis Electronically signed by: Oneil Devonshire MD 05/20/2024 01:36 AM EST RP Workstation: [id number]     Procedures  CRITICAL CARE Performed by: Almarie Burner   Total critical care time: 45 minutes  Critical care time was exclusive of separately billable procedures and treating other patients.  Critical care was necessary to treat or prevent imminent or life-threatening deterioration.  Critical care was time spent personally by me on the following activities: development of treatment plan with patient and/or surrogate as well as nursing, discussions with consultants, evaluation of patient's response to treatment, examination of patient, obtaining history from patient or surrogate, ordering and performing treatments and interventions, ordering and review of laboratory studies, ordering and review of radiographic studies, pulse oximetry and re-evaluation of patient's condition.  Medications Ordered in the ED  ondansetron  (ZOFRAN ) injection 4 mg (0 mg Intravenous Hold 05/20/24 0226)  0.9 %  sodium chloride  infusion (Manually program via Guardrails IV Fluids) (has no  administration in time range)  sodium chloride  0.9 % bolus 1,000 mL (0 mLs Intravenous Stopped 05/20/24 0414)  sodium chloride  0.9 % bolus 1,000 mL (0 mLs Intravenous Stopped 05/20/24 0448)  iohexol  (OMNIPAQUE ) 350 MG/ML injection 100 mL (100 mLs Intravenous Contrast Given 05/20/24 0102)  pantoprazole  (PROTONIX ) injection 80 mg (80 mg Intravenous Given 05/20/24 0354)                                    Medical Decision Making Amount and/or Complexity of Data Reviewed Labs: ordered. Radiology: ordered.  Risk Prescription drug management. Decision regarding hospitalization.   Patient with history of diverticulitis here for evaluation of hematochezia that started at 7 PM.  At time  of ED assessment she had had 8 episodes of large volume bright red blood per rectum.  Just after ED assessment she had 2 additional episodes of large-volume bright red blood per rectum.  She developed lightheadedness with the first episode and after the second episode she did have a syncopal episode that was brief in nature.  Hemoglobin is 10.  She does have external hemorrhoids on examination that do not appear to be actively bleeding.  Will start IV fluids, check repeat H&H.  Discussed with Dr. Rollin with GI, recommends transfer to [facility].  Will obtain CTA to evaluate for arterial bleed.  CTA is negative for acute bleed. Discussed with Dr.Kammerer, who accepts the patient in transfer.     Final diagnoses:  Acute GI bleeding  Acute blood loss anemia    ED Discharge Orders     None          Griselda Norris, MD 05/20/24 [phone]

## 2024-05-20 NOTE — ED Notes (Signed)
 Pt in bed, pt denies pain, resps even and unlabored, pt sinus rhythm on the monitor.

## 2024-05-20 NOTE — H&P (Addendum)
 History and Physical    Patient: Jillian Francis FMW:[id number] DOB: Sep 15, 1970 DOA: 05/19/2024 DOS: the patient was seen and examined on 05/20/2024 PCP: Ginette Shasta NOVAK, NP  Patient coming from: Home  Chief Complaint:  Chief Complaint  Patient presents with   Rectal Bleeding   HPI: Jillian Francis is a 53 y.o. female with medical history significant of fibroids s/p hysterectomy/salpingectomy (2013), anemia, hemorrhoids, and GERD p/w BRBPR x24h.  The patient reported experiencing rectal bleeding that began suddenly yesterday around 1900 while leaving her parents' house after having dinner. The bleeding was noticed initially upon using the bathroom, with a significant amount of blood observed. The patient described the blood as bright red initially covering the stool, then progressed to filling the toilet bowl with red - it looked like a murder scene, andlater it appeared darker upon arrival at the emergency room, where the bleeding increased. The patient experienced no pain initially, but later reported mild cramping similar to period cramps. There was a history of constipation over the past weekend, for which the patient used Trulance and MiraLAX. The patient mentioned having hemorrhoids a few years ago and polyps removed during a colonoscopy five years ago. The patient did not take any medication for blood pressure or anticoagulant therapy.  In the ED, pt hypotensive (90/60s). Labs showed Hb 10.8-->8.4. CTA abd/pelvis showed Acute uncomplicated diverticulitis of the rectosigmoid colon. No perforation, fluid collection, or abscess. CTA GIB showed No active GIB. EDP started IV PPI/MIVF/1U PRBC, consulted GI, and requested medicine PCU admission.   Review of Systems: As mentioned in the history of present illness. All other systems reviewed and are negative. 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 She has never been exposed to tobacco smoke. She has never used smokeless tobacco. She reports current alcohol use of about 2.0 standard drinks of alcohol per week. She reports that she does not use drugs.  Allergies[1]  Family History  Problem Relation Age of Onset   Hypertension Father    Cancer Maternal Grandmother        tumor  on the brain?    Prior to Admission medications  Medication Sig Start Date End Date Taking? Authorizing Provider  azelastine  (ASTELIN ) 0.1 % nasal spray Place 1 spray into both nostrils 2 (two) times daily as needed. 04/22/24   Lorin Norris, MD  Azelastine -Fluticasone  137-50 MCG/ACT SUSP PLACE 1 SPRAY INTO THE NOSE IN THE MORNING AND AT BEDTIME. 04/13/24   Lorin Norris, MD  diphenhydrAMINE  (BENADRYL ) 50 MG tablet Take one tablet (50 mg) by mouth 1 hour prior to contrast. 03/17/24   Cathlyn JAYSON Nikki Bobie FORBES, MD  EPINEPHrine  (AUVI-Q ) 0.3 mg/0.3 mL IJ SOAJ injection Inject 0.3 mg into the muscle as needed for anaphylaxis. 03/20/23   Lorin Norris, MD  Fezolinetant  (VEOZAH ) 45 MG TABS Take 1 tablet (45 mg  total) by mouth daily. 03/03/24   Chrzanowski, Jami B, NP  finasteride (PROPECIA) 1 MG tablet Take 1 mg by mouth daily. 06/26/23   [provider]  fluconazole  (DIFLUCAN ) 150 MG tablet TAKE 1 TABLET BY MOUTH EVERY 3 DAYS. 05/18/23   [provider]  fluconazole  (DIFLUCAN ) 200 MG tablet Take 1 tablet (200 mg total) by mouth daily as needed for up to 2 doses.  03/22/24   Cottie Donnice PARAS, MD  fluticasone  (FLONASE ) 50 MCG/ACT nasal spray Place 2 sprays into both nostrils daily. 04/22/24   Lorin Norris, MD  gabapentin (NEURONTIN) 100 MG capsule Take 100 mg by mouth 2 (two) times daily. 11/15/23 11/14/24  [provider]  lisdexamfetamine  (VYVANSE ) 40 MG capsule Take 40 mg by mouth daily.    [provider]  Magnesium Gluconate (MAGNESIUM 27 PO) Magnesium    [provider]  Misc Natural Products (JOINT SUPPORT COMPLEX PO) Joint Support    [provider]  Multiple Vitamins-Minerals (HAIR SKIN AND NAILS FORMULA PO) HAIR SKIN AND NAILS    [provider]  nitrofurantoin , macrocrystal-monohydrate, (MACROBID ) 100 MG capsule TAKE ONE CAPSULE BY MOUTH WITH INTERCOURSE. 05/06/24   Prentiss Annabella LABOR, NP  NON [facility]    [provider]  NON FORMULARY menopause vitamins    [provider]  olopatadine (PATANOL) 0.1 % ophthalmic solution Place 2 drops into both eyes at bedtime.    [provider]  Omeprazole-Sodium Bicarbonate (ZEGERID PO) Take by mouth.    [provider]  ondansetron  (ZOFRAN -ODT) 4 MG disintegrating tablet Take 1 tablet (4 mg total) by mouth every 8 (eight) hours as needed for up to 12 doses for nausea or vomiting. 03/22/24   Cottie Donnice PARAS, MD  ORLISTAT PO Take by mouth.    [provider]  predniSONE  (DELTASONE ) 50 MG tablet Take one tablet (50 mg) by mouth 13 hours prior to contrast.  Take one tablet (50 mg) by mouth 7 hours prior to contrast.  Take one tablet (50 mg) by mouth 1 hour prior to contrast. 03/17/24   Amundson C Silva, Brook E, MD  Probiotic Product (PROBIOTIC PO) Take by mouth as needed.     [provider]  tretinoin (ALTRALIN) 0.05 % gel Apply topically daily. 02/25/24   [provider]  TRULANCE 3 MG TABS 1 tab(s) orally once a day for 90 days 09/26/23   [provider]    Physical Exam: Vitals:    05/20/24 0705 05/20/24 0710 05/20/24 0745 05/20/24 0930  BP: 94/66 (!) 97/58 114/75 94/80  Pulse: 84 78 88 77  Resp: 15 17 15 14   Temp: 97.8 F (36.6 C)     TempSrc: Oral     SpO2: 100% 100% 100% 100%  Weight:      Height:       General: Alert, oriented x3, resting comfortably in no acute distress Cardiovascular: Regular rate and rhythm w/o m/r/g Abdomen: Soft, nontender, nondistended. Positive bowel sounds   Data Reviewed:  Lab Results  Component Value Date   WBC 6.0 05/19/2024   HGB 8.4 (L) 05/20/2024   HCT 26.0 (L) 05/20/2024   MCV 91.4 05/19/2024   PLT 310 05/19/2024   Lab Results  Component Value Date   GLUCOSE 86 05/19/2024   CALCIUM 9.1 05/19/2024   NA 141 05/19/2024   K 3.9 05/19/2024   CO2 24 05/19/2024   CL 105 05/19/2024   BUN 30 (H) 05/19/2024   CREATININE 0.73 05/19/2024  Lab Results  Component Value Date   ALT 17 05/19/2024   AST 20 05/19/2024   ALKPHOS 71 05/19/2024   BILITOT 0.5 05/19/2024   No results found for: INR, PROTIME Radiology: CT ANGIO GI BLEED Result Date: 05/20/2024 EXAM: CTA ABDOMEN AND PELVIS WITH CONTRAST 05/20/2024 01:12:55 AM TECHNIQUE: CTA images of the abdomen and pelvis with intravenous contrast. Three-dimensional MIP/volume rendered formations were performed. Automated exposure control, iterative reconstruction, and/or weight based adjustment of the mA/kV was utilized to reduce the radiation dose to as low as reasonably achievable. COMPARISON: 03/22/2024 CLINICAL HISTORY: Bony stools. FINDINGS: VASCULATURE: GI BLEED: No evidence of active extravasation or pooling of contrast material is noted to suggest active GI bleeding. AORTA: The abdominal aorta is within normal limits. No acute finding. No abdominal aortic aneurysm. No dissection. CELIAC TRUNK: No acute finding. No occlusion or significant stenosis. SUPERIOR MESENTERIC ARTERY: No acute finding. No occlusion or significant stenosis. INFERIOR MESENTERIC ARTERY: No acute  finding. No occlusion or significant stenosis. RENAL ARTERIES: No acute finding. No occlusion or significant stenosis. ILIAC ARTERIES: No acute finding. No occlusion or significant stenosis. ABDOMEN/PELVIS: LOWER CHEST: Visualized portion of the lower chest demonstrates no acute abnormality. LIVER: The liver is unremarkable. GALLBLADDER AND BILE DUCTS: Gallbladder is unremarkable. No biliary ductal dilatation. SPLEEN: The spleen is unremarkable. PANCREAS: The pancreas is unremarkable. ADRENAL GLANDS: Bilateral adrenal glands demonstrate no acute abnormality. KIDNEYS, URETERS AND BLADDER: Scattered cysts are noted within the right kidney. No follow-up is recommended. No obstructive changes are seen. No stones in the kidneys or ureters. No hydronephrosis. The bladder is partially distended. GI AND BOWEL: Stomach and duodenal sweep demonstrate no acute abnormality. Small bowel and stomach are within normal limits. Diverticular change of the colon is noted. There is no bowel obstruction. No abnormal bowel wall thickening or distension. REPRODUCTIVE: The uterus has been surgically removed. PERITONEUM AND RETROPERITONEUM: No ascites or free air. LYMPH NODES: No lymphadenopathy. BONES AND SOFT TISSUES: No acute abnormality of the bones. No acute soft tissue abnormality. IMPRESSION: 1. No active GI bleeding. 2. Diverticulosis without diverticulitis Electronically signed by: Oneil Devonshire MD 05/20/2024 01:36 AM EST RP Workstation: [id number]    Assessment and Plan: 59F h/o fibroids s/p hysterectomy/salpingectomy (2013), anemia, hemorrhoids, and GERD p/w BRBPR x24h.  GIB S/p 1U PRBC in ED -GI consulted; apprec eval/recs -2 large PIVs; f/u CBC q8h, goal Hb >7, transfuse prn; IV PPI BID; MIVF w/ LR 100cc/h x 24h; HOLD OAC and antiplatelet agents  -IV zofran  4mg  prn -NPO for now  ADHD -PTA Vyvanse  40mg  daily   Advance Care Planning:   Code Status: Full Code   Consults: GI  Family Communication: Husband,  [place]  Severity of Illness: The appropriate patient status for this patient is INPATIENT. Inpatient status is judged to be reasonable and necessary in order to provide the required intensity of service to ensure the patient's safety. The patient's presenting symptoms, physical exam findings, and initial radiographic and laboratory data in the context of their chronic comorbidities is felt to place them at high risk for further clinical deterioration. Furthermore, it is not anticipated that the patient will be medically stable for discharge from the hospital within 2 midnights of admission.   * I certify that at the point of admission it is my clinical judgment that the patient will require inpatient hospital care spanning beyond 2 midnights from the point of admission due to high intensity of service, high risk for further deterioration and high frequency of surveillance required.*   -------  I spent 61 minutes reviewing previous notes, at the bedside counseling/discussing the treatment plan, and performing clinical documentation.  Author: Marsha Ada, MD 05/20/2024 10:02 AM  For on call review [email or web address].      [1]  Allergies Allergen Reactions   Latex     Other reaction(s): Unknown   Shellfish Allergy     Shrimp (Diagnostic) Other (See Comments)    Throat swelling, facial redness   Wound Dressing Adhesive     Other reaction(s): Unknown   Morphine And Codeine Hives and Rash    Patient tolerates oxycodone 

## 2024-05-21 ENCOUNTER — Inpatient Hospital Stay (HOSPITAL_COMMUNITY): Admitting: Anesthesiology

## 2024-05-21 ENCOUNTER — Encounter (HOSPITAL_COMMUNITY): Payer: Self-pay | Admitting: Hospitalist

## 2024-05-21 ENCOUNTER — Encounter (HOSPITAL_COMMUNITY): Admission: EM | Disposition: A | Discharge status: Home / Self Care | Payer: Self-pay | Source: Home / Self Care

## 2024-05-21 DIAGNOSIS — K922 Gastrointestinal hemorrhage, unspecified: Secondary | ICD-10-CM | POA: Diagnosis not present

## 2024-05-21 HISTORY — PX: ESOPHAGOGASTRODUODENOSCOPY: SHX5428

## 2024-05-21 HISTORY — PX: COLONOSCOPY: SHX5424

## 2024-05-21 LAB — BPAM RBC
Blood Product Expiration Date: 202601122359
ISSUE DATE / TIME: 202512170355
Unit Type and Rh: 5100

## 2024-05-21 LAB — CBC
HCT: 26.9 % — ABNORMAL LOW (ref 36.0–46.0)
Hemoglobin: 8.7 g/dL — ABNORMAL LOW (ref 12.0–15.0)
MCH: 30 pg (ref 26.0–34.0)
MCHC: 32.3 g/dL (ref 30.0–36.0)
MCV: 92.8 fL (ref 80.0–100.0)
Platelets: 211 K/uL (ref 150–400)
RBC: 2.9 MIL/uL — ABNORMAL LOW (ref 3.87–5.11)
RDW: 14.9 % (ref 11.5–15.5)
WBC: 4.9 K/uL (ref 4.0–10.5)
nRBC: 0 % (ref 0.0–0.2)

## 2024-05-21 LAB — TYPE AND SCREEN
ABO/RH(D): O POS
Antibody Screen: NEGATIVE
Unit division: 0

## 2024-05-21 LAB — BASIC METABOLIC PANEL WITH GFR
Anion gap: 4 — ABNORMAL LOW (ref 5–15)
BUN: 6 mg/dL (ref 6–20)
CO2: 25 mmol/L (ref 22–32)
Calcium: 7.9 mg/dL — ABNORMAL LOW (ref 8.9–10.3)
Chloride: 113 mmol/L — ABNORMAL HIGH (ref 98–111)
Creatinine, Ser: 0.59 mg/dL (ref 0.44–1.00)
GFR, Estimated: >60 mL/min (ref >60)
Glucose, Bld: 88 mg/dL (ref 70–99)
Potassium: 3.4 mmol/L — ABNORMAL LOW (ref 3.5–5.1)
Sodium: 143 mmol/L (ref 135–145)

## 2024-05-21 SURGERY — COLONOSCOPY
Anesthesia: Moderate Sedation

## 2024-05-21 SURGERY — EGD (ESOPHAGOGASTRODUODENOSCOPY)
Anesthesia: Monitor Anesthesia Care

## 2024-05-21 MED ORDER — PHENYLEPHRINE 80 MCG/ML (10ML) SYRINGE FOR IV PUSH (FOR BLOOD PRESSURE SUPPORT)
PREFILLED_SYRINGE | INTRAVENOUS | Status: DC | PRN
  Administered 2024-05-21: 12:00:00 80 ug via INTRAVENOUS

## 2024-05-21 MED ORDER — POTASSIUM CHLORIDE CRYS ER 20 MEQ PO TBCR
40.0000 meq | EXTENDED_RELEASE_TABLET | Freq: Once | ORAL | Status: AC
  Administered 2024-05-21: 16:00:00 40 meq via ORAL

## 2024-05-21 MED ORDER — LIDOCAINE 2% (20 MG/ML) 5 ML SYRINGE
INTRAMUSCULAR | Status: DC | PRN
  Administered 2024-05-21: 12:00:00 60 mg via INTRAVENOUS

## 2024-05-21 MED ORDER — PROPOFOL 10 MG/ML IV BOLUS
INTRAVENOUS | Status: DC | PRN
  Administered 2024-05-21: 12:00:00 20 mg via INTRAVENOUS
  Administered 2024-05-21 (×2): 30 mg via INTRAVENOUS

## 2024-05-21 MED ORDER — PROPOFOL 500 MG/50ML IV EMUL
INTRAVENOUS | Status: DC | PRN
  Administered 2024-05-21: 12:00:00 200 ug/kg/min via INTRAVENOUS

## 2024-05-21 NOTE — Plan of Care (Signed)

## 2024-05-21 NOTE — Progress Notes (Signed)
 Patient will be discharging to home today. From home with support, independent, has PCP, no SDOH needs, no TOC needs identified.

## 2024-05-21 NOTE — Op Note (Signed)
 [facility] Patient Name: Jillian Francis Procedure Date : 05/21/2024 MRN: [id number] Attending MD: Renaye Sous , MD, [id number] Date of Birth: December 01, 1970 CSN: [id number] Age: 53 Admit Type: Inpatient Procedure:                Diagnostic colonoscopy Indications:              Rectal bleeding, Acute post hemorrhagic anemia,                            Colorectal cancer screening. Providers:                Renaye Sous, MD, Ozell Pouch, RN, Curtistine Bishop, Technician, Rome Ade, MD,                            Anesthesiologist, Duwaine Daring, CRNA. Referring MD:             Jamie Chrzanowski, NP Medicines:                Monitored Anesthesia Care Complications:            No immediate complications. Estimated Blood Loss:     Estimated blood loss: none. Procedure:                Pre-Anesthesia Assessment: - Prior to the                            procedure, a history and physical was performed,                            and patient medications and allergies were                            reviewed. The patient's tolerance of previous                            anesthesia was also reviewed. The risks and                            benefits of the procedure and the sedation options                            and risks were discussed with the patient. All                            questions were answered, and informed consent was                            obtained. Prior Anticoagulants: The patient has                            taken no anticoagulant or antiplatelet agents. ASA  Grade Assessment: II - A patient with mild systemic                            disease. After reviewing the risks and benefits,                            the patient was deemed in satisfactory condition to                            undergo the procedure. After obtaining informed                            consent, the colonoscope was passed  under direct                            vision. Throughout the procedure, the patient's                            blood pressure, pulse, and oxygen saturations were                            monitored continuously. The CF-HQ190L ([id number])                            Olympus colonoscopy was introduced through the anus                            and advanced to the the cecum, identified by                            appendiceal orifice and ileocecal valve. The                            colonoscopy was performed with difficulty due to                            inadequate bowel prep. Successful completion of the                            procedure was aided by lavage. The patient                            tolerated the procedure well. The quality of the                            bowel preparation was adequate. The ileocecal                            valve, the appendiceal orifice and the rectum were                            photographed. The quality of the bowel preparation  was evaluated using the BBPS [facility] Bowel                            Preparation Scale) with scores of: Right Colon = 2                            (minor amount of residual staining, small fragments                            of stool and/or opaque liquid, but mucosa seen                            well), Transverse Colon = 3 (entire mucosa seen                            well with no residual staining, small fragments of                            stool or opaque liquid) and Left Colon = 3 (entire                            mucosa seen well with no residual staining, small                            fragments of stool or opaque liquid). The total                            BBPS score equals 8. Scope In: 12:00:39 PM Scope Out: 12:15:35 PM Scope Withdrawal Time: 0 hours 3 minutes 35 seconds  Total Procedure Duration: 0 hours 14 minutes 56 seconds  Findings:      The perianal and digital  rectal examinations were normal.      Scattered large-mouthed, medium-mouthed and small-mouthed diverticula       were found in the entire colon.      Small internal hemorrhoids were noted on retroflexion. Impression:               - Scattered diverticulosis in the entire examined                            colon.                           - Small internal hemorrhoids.                           - No specimens collected. Recommendation:           - High fiber diet with augmented water consumption                            daily.                           - Continue present medications.                           -  Return to my office in 2 weeks. Procedure Code(s):        --- Professional ---                           [phone], Colonoscopy, flexible; diagnostic, including                            collection of specimen(s) by brushing or washing,                            when performed (separate procedure) Diagnosis Code(s):        --- Professional ---                           K62.5, Hemorrhage of anus and rectum                           D62, Acute posthemorrhagic anemia                           K57.30, Diverticulosis of large intestine without                            perforation or abscess without bleeding                           Z12.11, Encounter for screening for malignant                            neoplasm of colon CPT copyright 2022 American [employer] Association. All rights reserved. The codes documented in this report are preliminary and upon coder review may  be revised to meet current compliance requirements. Renaye Sous, MD Renaye Sous, MD 05/21/2024 12:32:32 PM This report has been signed electronically. Number of Addenda: 0

## 2024-05-21 NOTE — Progress Notes (Signed)
° °  Brief Progress Note   _____________________________________________________________________________________________________________  Patient Name: Jillian Francis Patient DOB: [birth year] Date: @TODAY @      Data: 53 year old female, who presented to the ED with a history profuse rectal bleeding that started about 7 PM last night when straining to facilitate a BM     Action: No action required at this time.    Response:  GI consulted. EGD/Colonoscopy scheduled for today.  _____________________________________________________________________________________________________________  [facility] RN Expeditor Shiree Altemus S Shiquita Collignon Please contact us  directly via secure chat (search for [facility]) or by calling us  at [phone] [facility]).

## 2024-05-21 NOTE — Discharge Summary (Signed)
 Physician Discharge Summary  Jillian Francis FMW:[id number] DOB: 1971/01/19 DOA: 05/19/2024  PCP: Ginette Shasta NOVAK, NP  Admit date: 05/19/2024 Discharge date: 05/21/2024  Admitted From: home Disposition:  home  Recommendations for Outpatient Follow-up:  Follow up with PCP in 1-2 weeks Please obtain BMP/CBC in one week Follow-up with Dr. Kristie in 1 to 2 weeks  Home Health: none Equipment/Devices: none  Discharge Condition: stable CODE STATUS: Full code Diet Orders (From admission, onward)     Start     Ordered   05/21/24 1218  Diet regular Room service appropriate? Yes; Fluid consistency: Thin  Diet effective now       Question Answer Comment  Room service appropriate? Yes   Fluid consistency: Thin      05/21/24 1217           HPI: Per admitting MD, 53 year old female with history of fibroids status post hysterectomy as well as appendectomy 2013, anemia, hemorrhoids, recent episode of diverticulitis who comes into the hospital with acute onset of bright red blood per rectum, painless.  She reports a lot of blood, and decided to come to the emergency room.  GI was consulted and she was admitted to the hospital  Hospital Course / Discharge diagnoses: Principal problem Acute lower GI bleed, likely diverticular bleed -in the ED underwent a CT angiogram which did not show any active GI bleed.  Gastroenterology has been consulted and followed patient while hospitalized.  She underwent a colonoscopy on 12/18 which showed scattered diverticulosis in the entire examined colon with small internal hemorrhoids.  On upper endoscopy was fairly unremarkable with normal duodenum and polyps in the gastric body.  Her bleeding has resolved, hemoglobin has remained stable, this likely represents a diverticular bleed given sudden onset and cessation, discussed with gastroenterology and patient will be discharged home in stable condition.  She will have outpatient follow-up with Dr. Kristie in  the next couple of weeks  Active problems ADHD-continue home medications on discharge Hypokalemia-potassium replenished  Sepsis ruled out   Discharge Instructions   Allergies as of 05/21/2024       Reactions   Latex    Other reaction(s): Unknown   Shellfish Allergy     Shrimp (diagnostic) Other (See Comments)   Throat swelling, facial redness   Wound Dressing Adhesive    Other reaction(s): Unknown   Morphine And Codeine Hives, Rash   Patient tolerates oxycodone         Medication List     TAKE these medications    azelastine  0.1 % nasal spray Commonly known as: ASTELIN  Place 1 spray into both nostrils 2 (two) times daily as needed.   Azelastine -Fluticasone  137-50 MCG/ACT Susp PLACE 1 SPRAY INTO THE NOSE IN THE MORNING AND AT BEDTIME.   Collagen Hydrolysate Powd Take 2 Scoops by mouth daily.   diphenhydrAMINE  50 MG tablet Commonly known as: BENADRYL  Take one tablet (50 mg) by mouth 1 hour prior to contrast.   EPINEPHrine  0.3 mg/0.3 mL Soaj injection Commonly known as: Auvi-Q  Inject 0.3 mg into the muscle as needed for anaphylaxis.   finasteride 1 MG tablet Commonly known as: PROPECIA Take 1 mg by mouth daily.   fluconazole  200 MG tablet Commonly known as: DIFLUCAN  Take 1 tablet (200 mg total) by mouth daily as needed for up to 2 doses.   fluticasone  50 MCG/ACT nasal spray Commonly known as: FLONASE  Place 2 sprays into both nostrils daily.   HAIR SKIN AND NAILS FORMULA PO HAIR SKIN AND NAILS  JOINT SUPPORT COMPLEX PO Joint Support   MAGNESIUM 27 PO Magnesium   nitrofurantoin  (macrocrystal-monohydrate) 100 MG capsule Commonly known as: MACROBID  TAKE ONE CAPSULE BY MOUTH WITH INTERCOURSE.   NON FORMULARY ASHWAGANDA   NON FORMULARY menopause vitamins   olopatadine 0.1 % ophthalmic solution Commonly known as: PATANOL Place 2 drops into both eyes at bedtime.   ondansetron  4 MG disintegrating tablet Commonly known as: ZOFRAN -ODT Take 1  tablet (4 mg total) by mouth every 8 (eight) hours as needed for up to 12 doses for nausea or vomiting.   polyethylene glycol 17 g packet Commonly known as: MIRALAX / GLYCOLAX Take 17 g by mouth daily.   predniSONE  50 MG tablet Commonly known as: DELTASONE  Take one tablet (50 mg) by mouth 13 hours prior to contrast.  Take one tablet (50 mg) by mouth 7 hours prior to contrast.  Take one tablet (50 mg) by mouth 1 hour prior to contrast.   PROBIOTIC PO Take by mouth as needed.   tretinoin 0.05 % gel Commonly known as: ALTRALIN Apply topically daily.   Trulance 3 MG Tabs Generic drug: Plecanatide 1 tab(s) orally once a day for 90 days   Veozah  45 MG Tabs Generic drug: Fezolinetant  Take 1 tablet (45 mg total) by mouth daily.   ZEGERID PO Take by mouth.   Zepbound 2.5 MG/0.5ML Pen Generic drug: tirzepatide Inject 3 mLs into the skin once a week.        Consultations: GI  Procedures/Studies: EGD Colonoscopy  CT ANGIO GI BLEED Result Date: 05/20/2024 EXAM: CTA ABDOMEN AND PELVIS WITH CONTRAST 05/20/2024 01:12:55 AM TECHNIQUE: CTA images of the abdomen and pelvis with intravenous contrast. Three-dimensional MIP/volume rendered formations were performed. Automated exposure control, iterative reconstruction, and/or weight based adjustment of the mA/kV was utilized to reduce the radiation dose to as low as reasonably achievable. COMPARISON: 03/22/2024 CLINICAL HISTORY: Bony stools. FINDINGS: VASCULATURE: GI BLEED: No evidence of active extravasation or pooling of contrast material is noted to suggest active GI bleeding. AORTA: The abdominal aorta is within normal limits. No acute finding. No abdominal aortic aneurysm. No dissection. CELIAC TRUNK: No acute finding. No occlusion or significant stenosis. SUPERIOR MESENTERIC ARTERY: No acute finding. No occlusion or significant stenosis. INFERIOR MESENTERIC ARTERY: No acute finding. No occlusion or significant stenosis. RENAL ARTERIES:  No acute finding. No occlusion or significant stenosis. ILIAC ARTERIES: No acute finding. No occlusion or significant stenosis. ABDOMEN/PELVIS: LOWER CHEST: Visualized portion of the lower chest demonstrates no acute abnormality. LIVER: The liver is unremarkable. GALLBLADDER AND BILE DUCTS: Gallbladder is unremarkable. No biliary ductal dilatation. SPLEEN: The spleen is unremarkable. PANCREAS: The pancreas is unremarkable. ADRENAL GLANDS: Bilateral adrenal glands demonstrate no acute abnormality. KIDNEYS, URETERS AND BLADDER: Scattered cysts are noted within the right kidney. No follow-up is recommended. No obstructive changes are seen. No stones in the kidneys or ureters. No hydronephrosis. The bladder is partially distended. GI AND BOWEL: Stomach and duodenal sweep demonstrate no acute abnormality. Small bowel and stomach are within normal limits. Diverticular change of the colon is noted. There is no bowel obstruction. No abnormal bowel wall thickening or distension. REPRODUCTIVE: The uterus has been surgically removed. PERITONEUM AND RETROPERITONEUM: No ascites or free air. LYMPH NODES: No lymphadenopathy. BONES AND SOFT TISSUES: No acute abnormality of the bones. No acute soft tissue abnormality. IMPRESSION: 1. No active GI bleeding. 2. Diverticulosis without diverticulitis Electronically signed by: Oneil Devonshire MD 05/20/2024 01:36 AM EST RP Workstation: [id number]     Subjective: - no  chest pain, shortness of breath, no abdominal pain, nausea or vomiting.   Discharge Exam: BP 125/70   Pulse 65   Temp (!) 97 F (36.1 C) (Temporal)   Resp 13   Ht 5' 8 (1.727 m)   Wt 63 kg   LMP 08/06/2011   SpO2 97%   BMI 21.12 kg/m   General: Pt is alert, awake, not in acute distress Cardiovascular: RRR, S1/S2 +, no rubs, no gallops Respiratory: CTA bilaterally, no wheezing, no rhonchi Abdominal: Soft, NT, ND, bowel sounds + Extremities: no edema, no cyanosis    The results of significant diagnostics  from this hospitalization (including imaging, microbiology, ancillary and laboratory) are listed below for reference.     Microbiology: No results found for this or any previous visit (from the past 240 hours).   Labs: Basic Metabolic Panel: Recent Labs  Lab 05/19/24 2135 05/21/24 0501  NA 141 143  K 3.9 3.4*  CL 105 113*  CO2 24 25  GLUCOSE 86 88  BUN 30* 6  CREATININE 0.73 0.59  CALCIUM 9.1 7.9*   Liver Function Tests: Recent Labs  Lab 05/19/24 2135  AST 20  ALT 17  ALKPHOS 71  BILITOT 0.5  PROT 6.7  ALBUMIN 4.3   CBC: Recent Labs  Lab 05/19/24 2135 05/20/24 0051 05/20/24 1038 05/20/24 2016 05/21/24 0501  WBC 6.0  --   --   --  4.9  HGB 10.8* 8.4* 8.9* 8.9* 8.7*  HCT 33.1* 26.0* 27.7* 27.2* 26.9*  MCV 91.4  --   --   --  92.8  PLT 310  --   --   --  211   CBG: No results for input(s): GLUCAP in the last 168 hours. Hgb A1c No results for input(s): HGBA1C in the last 72 hours. Lipid Profile No results for input(s): CHOL, HDL, LDLCALC, TRIG, CHOLHDL, LDLDIRECT in the last 72 hours. Thyroid function studies No results for input(s): TSH, T4TOTAL, T3FREE, THYROIDAB in the last 72 hours.  Invalid input(s): FREET3 Urinalysis    Component Value Date/Time   COLORURINE ORANGE (A) 03/22/2024 1451   APPEARANCEUR TURBID (A) 03/22/2024 1451   LABSPEC  03/22/2024 1451    TEST NOT REPORTED DUE TO COLOR INTERFERENCE OF URINE PIGMENT   PHURINE  03/22/2024 1451    TEST NOT REPORTED DUE TO COLOR INTERFERENCE OF URINE PIGMENT   GLUCOSEU (A) 03/22/2024 1451    TEST NOT REPORTED DUE TO COLOR INTERFERENCE OF URINE PIGMENT   HGBUR (A) 03/22/2024 1451    TEST NOT REPORTED DUE TO COLOR INTERFERENCE OF URINE PIGMENT   BILIRUBINUR (A) 03/22/2024 1451    TEST NOT REPORTED DUE TO COLOR INTERFERENCE OF URINE PIGMENT   BILIRUBINUR neg 07/16/2016 1527   KETONESUR (A) 03/22/2024 1451    TEST NOT REPORTED DUE TO COLOR INTERFERENCE OF URINE PIGMENT    PROTEINUR (A) 03/22/2024 1451    TEST NOT REPORTED DUE TO COLOR INTERFERENCE OF URINE PIGMENT   UROBILINOGEN negative 07/16/2016 1527   UROBILINOGEN 0.2 11/25/2009 1725   NITRITE NEGATIVE 03/22/2024 1451   LEUKOCYTESUR (A) 03/22/2024 1451    TEST NOT REPORTED DUE TO COLOR INTERFERENCE OF URINE PIGMENT    FURTHER DISCHARGE INSTRUCTIONS:   Get Medicines reviewed and adjusted: Please take all your medications with you for your next visit with your Primary MD   Laboratory/radiological data: Please request your Primary MD to go over all hospital tests and procedure/radiological results at the follow up, please ask your Primary MD to  get all Hospital records sent to his/her office.   In some cases, they will be blood work, cultures and biopsy results pending at the time of your discharge. Please request that your primary care M.D. goes through all the records of your hospital data and follows up on these results.   Also Note the following: If you experience worsening of your admission symptoms, develop shortness of breath, life threatening emergency, suicidal or homicidal thoughts you must seek medical attention immediately by calling 911 or calling your MD immediately  if symptoms less severe.   You must read complete instructions/literature along with all the possible adverse reactions/side effects for all the Medicines you take and that have been prescribed to you. Take any new Medicines after you have completely understood and accpet all the possible adverse reactions/side effects.    Do not drive when taking Pain medications or sleeping medications (Benzodaizepines)   Do not take more than prescribed Pain, Sleep and Anxiety Medications. It is not advisable to combine anxiety,sleep and pain medications without talking with your primary care practitioner   Special Instructions: If you have smoked or chewed Tobacco  in the last 2 yrs please stop smoking, stop any regular Alcohol  and or any  Recreational drug use.   Wear Seat belts while driving.   Please note: You were cared for by a hospitalist during your hospital stay. Once you are discharged, your primary care physician will handle any further medical issues. Please note that NO REFILLS for any discharge medications will be authorized once you are discharged, as it is imperative that you return to your primary care physician (or establish a relationship with a primary care physician if you do not have one) for your post hospital discharge needs so that they can reassess your need for medications and monitor your lab values.  Time coordinating discharge: 35 minutes  SIGNED:  Nilda Fendt, MD, PhD 05/21/2024, 2:30 PM

## 2024-05-21 NOTE — Anesthesia Postprocedure Evaluation (Signed)
 Anesthesia Post Note  Patient: Jillian Francis  Procedure(s) Performed: EGD (ESOPHAGOGASTRODUODENOSCOPY) COLONOSCOPY     Patient location during evaluation: Endoscopy Anesthesia Type: MAC Level of consciousness: awake and alert Pain management: pain level controlled Vital Signs Assessment: post-procedure vital signs reviewed and stable Respiratory status: spontaneous breathing, nonlabored ventilation, respiratory function stable and patient connected to nasal cannula oxygen Cardiovascular status: blood pressure returned to baseline and stable Postop Assessment: no apparent nausea or vomiting Anesthetic complications: no   No notable events documented.  Last Vitals:  Vitals:   05/21/24 1300 05/21/24 1310  BP: 129/72 125/70  Pulse: 65 66  Resp: 13 13  Temp:    SpO2: 99% 98%    Last Pain:  Vitals:   05/21/24 1352  TempSrc:   PainSc: 0-No pain                 Rome Ade

## 2024-05-21 NOTE — Transfer of Care (Signed)
 Immediate Anesthesia Transfer of Care Note  Patient: Jillian Francis  Procedure(s) Performed: EGD (ESOPHAGOGASTRODUODENOSCOPY) COLONOSCOPY  Patient Location: PACU  Anesthesia Type:MAC  Level of Consciousness: awake and drowsy  Airway & Oxygen Therapy: Patient Spontanous Breathing  Post-op Assessment: Report given to RN  Post vital signs: Reviewed and stable  Last Vitals:  Vitals Value Taken Time  BP 115/91 05/21/24 12:23  Temp 36.1 C 05/21/24 12:23  Pulse 77 05/21/24 12:25  Resp 11 05/21/24 12:25  SpO2 99 % 05/21/24 12:25  Vitals shown include unfiled device data.  Last Pain:  Vitals:   05/21/24 1223  TempSrc: Temporal  PainSc: 0-No pain         Complications: No notable events documented.

## 2024-05-21 NOTE — Discharge Instructions (Signed)
 Follow up with Dr Kristie in 1-2 weeks  Please get a complete blood count and chemistry panel checked by your Primary MD at your next visit, and again as instructed by your Primary MD. Please get your medications reviewed and adjusted by your Primary MD.  Please request your Primary MD to go over all Hospital Tests and Procedure/Radiological results at the follow up, please get all Hospital records sent to your Prim MD by signing hospital release before you go home.  In some cases, there will be blood work, cultures and biopsy results pending at the time of your discharge. Please request that your primary care M.D. goes through all the records of your hospital data and follows up on these results.  If you had Pneumonia of Lung problems at the Hospital: Please get a 2 view Chest X ray done in 6-8 weeks after hospital discharge or sooner if instructed by your Primary MD.  If you have Congestive Heart Failure: Please call your Cardiologist or Primary MD anytime you have any of the following symptoms:  1) 3 pound weight gain in 24 hours or 5 pounds in 1 week  2) shortness of breath, with or without a dry hacking cough  3) swelling in the hands, feet or stomach  4) if you have to sleep on extra pillows at night in order to breathe  Follow cardiac low salt diet and 1.5 lit/day fluid restriction.  If you have diabetes Accuchecks 4 times/day, Once in AM empty stomach and then before each meal. Log in all results and show them to your primary doctor at your next visit. If any glucose reading is under 80 or above 300 call your primary MD immediately.  If you have Seizure/Convulsions/Epilepsy: Please do not drive, operate heavy machinery, participate in activities at heights or participate in high speed sports until you have seen by Primary MD or a Neurologist and advised to do so again. Per [place]  DMV statutes, patients with seizures are not allowed to drive until they have been seizure-free  for six months.  Use caution when using heavy equipment or power tools. Avoid working on ladders or at heights. Take showers instead of baths. Ensure the water temperature is not too high on the home water heater. Do not go swimming alone. Do not lock yourself in a room alone (i.e. bathroom). When caring for infants or small children, sit down when holding, feeding, or changing them to minimize risk of injury to the child in the event you have a seizure. Maintain good sleep hygiene. Avoid alcohol.   If you had Gastrointestinal Bleeding: Please ask your Primary MD to check a complete blood count within one week of discharge or at your next visit. Your endoscopic/colonoscopic biopsies that are pending at the time of discharge, will also need to followed by your Primary MD.  Get Medicines reviewed and adjusted. Please take all your medications with you for your next visit with your Primary MD  Please request your Primary MD to go over all hospital tests and procedure/radiological results at the follow up, please ask your Primary MD to get all Hospital records sent to his/her office.  If you experience worsening of your admission symptoms, develop shortness of breath, life threatening emergency, suicidal or homicidal thoughts you must seek medical attention immediately by calling 911 or calling your MD immediately  if symptoms less severe.  You must read complete instructions/literature along with all the possible adverse reactions/side effects for all the Medicines you take  and that have been prescribed to you. Take any new Medicines after you have completely understood and accpet all the possible adverse reactions/side effects.   Do not drive or operate heavy machinery when taking Pain medications.   Do not take more than prescribed Pain, Sleep and Anxiety Medications  Special Instructions: If you have smoked or chewed Tobacco  in the last 2 yrs please stop smoking, stop any regular Alcohol  and or  any Recreational drug use.  Wear Seat belts while driving.  Please note You were cared for by a hospitalist during your hospital stay. If you have any questions about your discharge medications or the care you received while you were in the hospital after you are discharged, you can call the unit and asked to speak with the hospitalist on call if the hospitalist that took care of you is not available. Once you are discharged, your primary care physician will handle any further medical issues. Please note that NO REFILLS for any discharge medications will be authorized once you are discharged, as it is imperative that you return to your primary care physician (or establish a relationship with a primary care physician if you do not have one) for your aftercare needs so that they can reassess your need for medications and monitor your lab values.  You can reach the hospitalist office at phone [phone] or fax [phone]   If you do not have a primary care physician, you can call [phone] for a physician referral.  Activity: As tolerated with Full fall precautions use walker/cane & assistance as needed    Diet: regular  Disposition Home

## 2024-05-21 NOTE — Anesthesia Preprocedure Evaluation (Signed)
 Anesthesia Evaluation  Patient identified by MRN, date of birth, ID band Patient awake    Reviewed: Allergy  & Precautions, NPO status , Patient's Chart, lab work & pertinent test results  History of Anesthesia Complications (+) PONV and history of anesthetic complications  Airway Mallampati: II  TM Distance: >3 FB Neck ROM: Full    Dental no notable dental hx. (+) Teeth Intact   Pulmonary neg pulmonary ROS, neg sleep apnea, neg COPD, Patient abstained from smoking.Not current smoker   Pulmonary exam normal breath sounds clear to auscultation       Cardiovascular Exercise Tolerance: Good METS(-) hypertension(-) CAD and (-) Past MI negative cardio ROS (-) dysrhythmias  Rhythm:Regular Rate:Normal - Systolic murmurs    Neuro/Psych  Headaches  negative psych ROS   GI/Hepatic ,GERD  ,,(+)     (-) substance abuse  Lower GI bleed. Denies N/V    Endo/Other  neg diabetes  GLP1ra held for 8 days. 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     Comment:  by C&S and IgG No date: PONV (postoperative nausea and vomiting)  Reproductive/Obstetrics                              Anesthesia Physical Anesthesia Plan  ASA: 2  Anesthesia Plan: MAC   Post-op Pain Management: Minimal or no pain anticipated   Induction: Intravenous  PONV Risk Score and Plan: 3 and Propofol  infusion, TIVA and Ondansetron   Airway Management Planned: Nasal Cannula  Additional Equipment: None  Intra-op Plan:   Post-operative Plan:   Informed Consent: I have reviewed the patients History and  Physical, chart, labs and discussed the procedure including the risks, benefits and alternatives for the proposed anesthesia with the patient or authorized representative who has indicated his/her understanding and acceptance.     Dental advisory given  Plan Discussed with: CRNA and Surgeon  Anesthesia Plan Comments: (Discussed risks of anesthesia with patient, including possibility of difficulty with spontaneous ventilation under anesthesia necessitating airway intervention, PONV, and rare risks such as cardiac or respiratory or neurological events, and allergic reactions. Discussed the role of CRNA in patient's perioperative care. Patient understands.)        Anesthesia Quick Evaluation

## 2024-05-21 NOTE — Interval H&P Note (Signed)
 History and Physical Interval Note:  05/21/2024 11:32 AM  Jillian Francis  has presented today for surgery, with the diagnosis of GI bleed.  The various methods of treatment have been discussed with the patient and family. After consideration of risks, benefits and other options for treatment, the patient has consented to  Procedures: EGD (ESOPHAGOGASTRODUODENOSCOPY) (N/A) COLONOSCOPY (N/A) as a surgical intervention.  The patient's history has been reviewed, patient examined, no change in status, stable for surgery.  I have reviewed the patient's chart and labs.  Questions were answered to the patient's satisfaction.     Renaye Sous

## 2024-05-21 NOTE — Op Note (Signed)
 [facility] Patient Name: Jillian Francis Procedure Date : 05/21/2024 MRN: [id number] Attending MD: Renaye Sous , MD, [id number] Date of Birth: December 23, 1970 CSN: [id number] Age: 53 Admit Type: Inpatient Procedure:                Diagnostic EGD. Indications:              Rectal bleeding/Melena, Acute post hemorrhagic                            anemia, Gastro-esophageal reflux disease Providers:                Renaye Sous, MD, Ozell Pouch, Coye Bade, Technician, Curtistine Bishop, Technician,                            Rome Ade, MD, Anesthesiologist, Duwaine Daring, CRNA Referring MD:             Warren Bland, NP Medicines:                Monitored Anesthesia Care Complications:            No immediate complications. Estimated Blood Loss:     Estimated blood loss: none. Procedure:                Pre-Anesthesia Assessment: - Prior to the                            procedure, a history and physical was performed,                            and patient medications and allergies were                            reviewed. The patient's tolerance of previous                            anesthesia was also reviewed. The risks and                            benefits of the procedure and the sedation options                            and risks were discussed with the patient. All                            questions were answered, and informed consent was                            obtained. Prior Anticoagulants: The patient has                            taken no anticoagulant or antiplatelet agents. ASA  Grade Assessment: II - A patient with mild systemic                            disease. After reviewing the risks and benefits,                            the patient was deemed in satisfactory condition to                            undergo the procedure. After obtaining informed                            consent,  the endoscope was passed under direct                            vision. Throughout the procedure, the patient's                            blood pressure, pulse, and oxygen saturations were                            monitored continuously. The [id number]-[id number] ([id number])                            Olympus endoscope was introduced through the mouth,                            and advanced to the second part of duodenum. The                            upper GI endoscopy was accomplished without                            difficulty. The patient tolerated the procedure                            well. Scope In: Scope Out: Findings:      The examined esophagus and GEJ appeared widely patent and normal.      Multiple sessile polyps with no bleeding were found in the gastric body.      The cardia and gastric fundus were normal on retroflexion.      The examined duodenum was normal. Impression:               - Normal appearing, widely patent esophagus and GEJ.                           - Multiple gastric polyps in the body; otherwise                            normal appearing stomach.                           - Normal examined duodenum.                           -  No specimens collected. Recommendation:           - High fiber diet with libetral fluid inatke.                           - Continue present medications.                           - Return to my office in 2 weeks. Procedure Code(s):        --- Professional ---                           [phone], Esophagogastroduodenoscopy, flexible,                            transoral; diagnostic, including collection of                            specimen(s) by brushing or washing, when performed                            (separate procedure) Diagnosis Code(s):        --- Professional ---                           K62.5, Hemorrhage of anus and rectum                           D62, Acute posthemorrhagic anemia                           K21.9,  Gastro-esophageal reflux disease without                            esophagitis                           K31.7, Polyp of stomach and duodenum CPT copyright 2022 [employer]. All rights reserved. The codes documented in this report are preliminary and upon coder review may  be revised to meet current compliance requirements. Renaye Sous, MD Renaye Sous, MD 05/21/2024 12:26:05 PM This report has been signed electronically. Number of Addenda: 0

## 2024-05-24 ENCOUNTER — Encounter (HOSPITAL_COMMUNITY): Payer: Self-pay | Admitting: Gastroenterology

## 2024-06-12 ENCOUNTER — Other Ambulatory Visit: Payer: Self-pay | Admitting: General Surgery

## 2024-06-12 DIAGNOSIS — D241 Benign neoplasm of right breast: Secondary | ICD-10-CM

## 2024-06-19 ENCOUNTER — Other Ambulatory Visit: Payer: Self-pay | Admitting: Internal Medicine

## 2024-06-19 ENCOUNTER — Other Ambulatory Visit: Payer: Self-pay | Admitting: Radiology

## 2024-06-19 NOTE — Telephone Encounter (Signed)
 Med refill request: valacyclovir  500 mg Last AEX: 12/11/23 Next AEX: none scheduled Last MMG (if hormonal med) n/a Last refill: 06/11/2023 DX: history of genital herpes  Refill authorized:  valacyclovir  500 mg #30 with 2 refills Sent to provider for approval

## 2024-06-24 ENCOUNTER — Other Ambulatory Visit: Payer: Self-pay | Admitting: General Surgery

## 2024-06-24 DIAGNOSIS — R203 Hyperesthesia: Secondary | ICD-10-CM

## 2024-06-24 DIAGNOSIS — N6452 Nipple discharge: Secondary | ICD-10-CM

## 2024-06-24 DIAGNOSIS — D241 Benign neoplasm of right breast: Secondary | ICD-10-CM

## 2024-06-25 ENCOUNTER — Other Ambulatory Visit: Payer: Self-pay | Admitting: Nurse Practitioner

## 2024-06-26 NOTE — Telephone Encounter (Signed)
 Med refill request:   nitrofurantoin , macrocrystal-monohydrate, (MACROBID ) 100 MG capsule  Start:  05/06/24 Disp:  15 capsules Refills:  0  Last AEX:  12/11/23 Next AEX:  Not yet scheduled Last MMG (if hormonal med):  08/21/23 Refill authorized? Please Advise.

## 2024-07-03 ENCOUNTER — Ambulatory Visit
Admission: RE | Admit: 2024-07-03 | Discharge: 2024-07-03 | Disposition: A | Source: Ambulatory Visit | Attending: General Surgery | Admitting: General Surgery

## 2024-07-03 DIAGNOSIS — N6452 Nipple discharge: Secondary | ICD-10-CM

## 2024-07-03 DIAGNOSIS — D241 Benign neoplasm of right breast: Secondary | ICD-10-CM

## 2024-07-03 DIAGNOSIS — R203 Hyperesthesia: Secondary | ICD-10-CM

## 2024-07-03 MED ORDER — GADOPICLENOL 0.5 MMOL/ML IV SOLN
6.0000 mL | Freq: Once | INTRAVENOUS | Status: AC | PRN
  Administered 2024-07-03: 6 mL via INTRAVENOUS
